# Patient Record
Sex: Female | Born: 1970 | Race: Black or African American | Hispanic: No | Marital: Single | Smoking: Former smoker
Health system: Southern US, Community
[De-identification: ages and names within clinical notes are randomized; demographics above are authoritative.]

## PROBLEM LIST (undated history)

## (undated) DIAGNOSIS — M1990 Unspecified osteoarthritis, unspecified site: Secondary | ICD-10-CM

## (undated) DIAGNOSIS — Z8709 Personal history of other diseases of the respiratory system: Secondary | ICD-10-CM

## (undated) DIAGNOSIS — I10 Essential (primary) hypertension: Secondary | ICD-10-CM

## (undated) DIAGNOSIS — E119 Type 2 diabetes mellitus without complications: Secondary | ICD-10-CM

## (undated) DIAGNOSIS — J45909 Unspecified asthma, uncomplicated: Secondary | ICD-10-CM

## (undated) HISTORY — PX: MOUTH SURGERY: SHX715

## (undated) HISTORY — PX: ANKLE SURGERY: SHX546

---

## 2009-09-08 ENCOUNTER — Emergency Department (HOSPITAL_COMMUNITY): Admission: EM | Admit: 2009-09-08 | Discharge: 2009-09-08 | Payer: Self-pay | Admitting: Family Medicine

## 2010-07-27 ENCOUNTER — Ambulatory Visit (HOSPITAL_COMMUNITY)
Admission: RE | Admit: 2010-07-27 | Discharge: 2010-07-27 | Disposition: A | Discharge status: Home / Self Care | Payer: No Typology Code available for payment source | Source: Ambulatory Visit | Attending: Orthopedic Surgery | Admitting: Orthopedic Surgery

## 2010-07-27 ENCOUNTER — Ambulatory Visit (HOSPITAL_BASED_OUTPATIENT_CLINIC_OR_DEPARTMENT_OTHER)
Admission: RE | Admit: 2010-07-27 | Discharge: 2010-07-27 | Disposition: A | Discharge status: Home / Self Care | Payer: Self-pay | Source: Ambulatory Visit | Attending: Orthopedic Surgery | Admitting: Orthopedic Surgery

## 2010-07-27 ENCOUNTER — Ambulatory Visit
Admission: RE | Admit: 2010-07-27 | Discharge: 2010-07-27 | Disposition: A | Discharge status: Home / Self Care | Payer: No Typology Code available for payment source | Source: Ambulatory Visit | Attending: Orthopedic Surgery | Admitting: Orthopedic Surgery

## 2010-07-27 ENCOUNTER — Other Ambulatory Visit: Payer: Self-pay | Admitting: Orthopedic Surgery

## 2010-07-27 ENCOUNTER — Other Ambulatory Visit (HOSPITAL_COMMUNITY): Payer: Self-pay | Admitting: Orthopedic Surgery

## 2010-07-27 DIAGNOSIS — Y9351 Activity, roller skating (inline) and skateboarding: Secondary | ICD-10-CM | POA: Insufficient documentation

## 2010-07-27 DIAGNOSIS — S82853A Displaced trimalleolar fracture of unspecified lower leg, initial encounter for closed fracture: Secondary | ICD-10-CM | POA: Insufficient documentation

## 2010-07-27 DIAGNOSIS — X58XXXA Exposure to other specified factors, initial encounter: Secondary | ICD-10-CM | POA: Insufficient documentation

## 2010-07-27 DIAGNOSIS — S82851A Displaced trimalleolar fracture of right lower leg, initial encounter for closed fracture: Secondary | ICD-10-CM

## 2010-07-27 DIAGNOSIS — Z01811 Encounter for preprocedural respiratory examination: Secondary | ICD-10-CM

## 2010-07-27 DIAGNOSIS — Z01812 Encounter for preprocedural laboratory examination: Secondary | ICD-10-CM | POA: Insufficient documentation

## 2010-07-27 DIAGNOSIS — Z01810 Encounter for preprocedural cardiovascular examination: Secondary | ICD-10-CM | POA: Insufficient documentation

## 2010-07-27 LAB — POCT I-STAT, CHEM 8
Calcium, Ion: 1.1 mmol/L — ABNORMAL LOW (ref 1.12–1.32)
Glucose, Bld: 109 mg/dL — ABNORMAL HIGH (ref 70–99)
HCT: 44 % (ref 36.0–46.0)
Hemoglobin: 15 g/dL (ref 12.0–15.0)
Potassium: 3.4 mEq/L — ABNORMAL LOW (ref 3.5–5.1)

## 2010-07-31 NOTE — Op Note (Signed)
NAME:  Lisa, [name] NO.:  [id number]  MEDICAL RECORD NO.:  [id number]           PATIENT TYPE:  O  LOCATION:  XRAY                         FACILITY:  [facility]  PHYSICIAN:  Eulas Post, MD    DATE OF BIRTH:  [birth year]  DATE OF PROCEDURE:  07/27/2010 DATE OF DISCHARGE:  07/27/2010                              OPERATIVE REPORT   ATTENDING SURGEON:  Eulas Post, MD  FIRST ASSISTANT:  Janace Litten, orthopedic PA-C  PREOPERATIVE DIAGNOSIS:  Right trimalleolar ankle fracture.  POSTOPERATIVE DIAGNOSIS:  Right trimalleolar ankle fracture.  OPERATIVE PROCEDURE:  Over reduction and internal fixation of right trimalleolar ankle fracture without fixation of the posterior lip.  ANESTHESIA:  General.  ESTIMATED BLOOD LOSS:  Minimal.  TOURNIQUET TIME:  Approximately 1 hour and 20 minutes.  OPERATIVE IMPLANTS:  A Synthes one-third tubular plate size 6-hole with 2 distal cancellous screws and 3 proximal cortical screws with a single interfragmentary lag screw and a total of 2 cannulated 4-0 medial malleolar screws.  PREOPERATIVE INDICATIONS:  Lisa [name] is a 40 year old woman who about 2 weeks ago fell roller-skating and broke her right ankle. She had a fracture dislocation that underwent closed reduction emergently.  She then elected to undergo open reduction and internal fixation in order to optimize healing and minimize the risk for malunion and nonunion.  The risks, benefits, and alternatives were discussed with her before the procedure including but not limited to risks of infection, bleeding, nerve injury, malunion, nonunion, hardware prominence, hardware failure, the need for hardware removal, posttraumatic arthritis, stiffness, regional pain syndrome, cardiopulmonary complications, among others and she is willing to proceed.  OPERATIVE PROCEDURE:  The patient was brought to the operating room and placed in supine position.  IV vancomycin  was given due to a penicillin sensitivity.  After general anesthesia was administered and she also had a regional block, the right lower extremity was prepped and draped in the usual sterile fashion.  The leg was elevated and exsanguinated and tourniquet was inflated.  Incision was made over the distal fibula.  The fracture was identified and cleaned and reduced anatomically and held with clamps.  I placed interfragmentary lag screw.  There was a long separate butterfly segment.  After I had the interfragmentary lag screw, I applied the 6-hole plate.  Excellent fixation was achieved.  I then reduced the butterfly segment and held it and placed a #1 Vicryl cerclage around this butterfly segment after keying it nearly anatomically.  I then turned my attention to the medial side.  Incision was made over the medial malleolus.  There was some posterior fragmentation, and there was also an interposed small area of comminuted cartilage from the medial shoulder of the distal plafond.  This small portion was preventing reduction and was therefore removed.  This was not large enough to hold a screw or able to be maintained.  Therefore, I then reduced the medial malleolus anatomically and held it provisionally with a clamp and then placed 2 cannulated guidewires and confirmed position on AP and lateral views.  I then placed the cannulated screws. Excellent fixation and compression  was achieved.  I then turned my attention to the posterior malleolus.  Under live C-arm guidance, I examined posterior malleolus and took the ankle through a range of motion and the posterior piece was found to be small, maybe 10% of the articular surface at most, and was nearly anatomically reduced after fixation of the medial and lateral sides, and the talus was concentric and the ankle joint stable.  For these reasons, I did not fix the posterior malleolus.  It appeared to be fairly small.  Then, I turned my  attention to the syndesmosis and I stressed the syndesmosis and it was found to be stable with both a cotton test as well as dorsiflexion and external rotation.  Final C-arm pictures were taken.  I irrigated the wounds copiously and repaired them with Vicryl followed by Steri-Strips and sterile gauze and a posterior splint.  The tourniquet was released.  The patient was awakened and returned to PACU in stable and satisfactory condition.  We also did inject her medial side with Marcaine.  Janace Litten, orthopedic PA-C was present and scrubbed throughout the case and critical for completion both with the assistance with reduction as well as exposure and positioning and closure.  She tolerated the procedure well.  There were no complications.     Eulas Post, MD     JPL/MEDQ  D:  07/27/2010  T:  07/28/2010  Job:  [id number]  Electronically Signed by Teryl Lucy MD on 07/31/2010 07:45:14 AM

## 2010-11-19 ENCOUNTER — Inpatient Hospital Stay (INDEPENDENT_AMBULATORY_CARE_PROVIDER_SITE_OTHER)
Admission: RE | Admit: 2010-11-19 | Discharge: 2010-11-19 | Disposition: A | Discharge status: Home / Self Care | Payer: 59 | Source: Ambulatory Visit | Attending: Family Medicine | Admitting: Family Medicine

## 2010-11-19 DIAGNOSIS — R42 Dizziness and giddiness: Secondary | ICD-10-CM

## 2011-04-30 DIAGNOSIS — Z8709 Personal history of other diseases of the respiratory system: Secondary | ICD-10-CM

## 2011-04-30 HISTORY — DX: Personal history of other diseases of the respiratory system: Z87.09

## 2012-04-14 ENCOUNTER — Encounter (HOSPITAL_COMMUNITY): Payer: Self-pay

## 2012-04-14 ENCOUNTER — Encounter (HOSPITAL_COMMUNITY)
Admission: RE | Admit: 2012-04-14 | Discharge: 2012-04-14 | Disposition: A | Discharge status: Home / Self Care | Payer: 59 | Source: Ambulatory Visit | Attending: Orthopaedic Surgery | Admitting: Orthopaedic Surgery

## 2012-04-14 ENCOUNTER — Other Ambulatory Visit (HOSPITAL_COMMUNITY): Payer: Self-pay | Admitting: Orthopaedic Surgery

## 2012-04-14 ENCOUNTER — Encounter (HOSPITAL_COMMUNITY): Payer: Self-pay | Admitting: Pharmacy Technician

## 2012-04-14 ENCOUNTER — Encounter (HOSPITAL_COMMUNITY)
Admission: RE | Admit: 2012-04-14 | Discharge: 2012-04-14 | Disposition: A | Discharge status: Home / Self Care | Payer: 59 | Source: Ambulatory Visit | Attending: Anesthesiology | Admitting: Anesthesiology

## 2012-04-14 DIAGNOSIS — J45909 Unspecified asthma, uncomplicated: Secondary | ICD-10-CM | POA: Insufficient documentation

## 2012-04-14 DIAGNOSIS — F17200 Nicotine dependence, unspecified, uncomplicated: Secondary | ICD-10-CM | POA: Insufficient documentation

## 2012-04-14 DIAGNOSIS — I10 Essential (primary) hypertension: Secondary | ICD-10-CM | POA: Insufficient documentation

## 2012-04-14 DIAGNOSIS — J9383 Other pneumothorax: Secondary | ICD-10-CM | POA: Insufficient documentation

## 2012-04-14 DIAGNOSIS — Z01818 Encounter for other preprocedural examination: Secondary | ICD-10-CM | POA: Insufficient documentation

## 2012-04-14 HISTORY — DX: Essential (primary) hypertension: I10

## 2012-04-14 HISTORY — DX: Unspecified asthma, uncomplicated: J45.909

## 2012-04-14 LAB — SURGICAL PCR SCREEN: Staphylococcus aureus: NEGATIVE

## 2012-04-14 LAB — COMPREHENSIVE METABOLIC PANEL
AST: 18 U/L (ref 0–37)
CO2: 26 mEq/L (ref 19–32)
Chloride: 102 mEq/L (ref 96–112)
Creatinine, Ser: 0.65 mg/dL (ref 0.50–1.10)
GFR calc Af Amer: >90 mL/min (ref >90)
GFR calc non Af Amer: >90 mL/min (ref >90)
Glucose, Bld: 110 mg/dL — ABNORMAL HIGH (ref 70–99)
Total Bilirubin: 0.5 mg/dL (ref 0.3–1.2)

## 2012-04-14 LAB — CBC
MCH: 28.7 pg (ref 26.0–34.0)
MCHC: 33.8 g/dL (ref 30.0–36.0)
Platelets: 279 10*3/uL (ref 150–400)
RBC: 4.56 MIL/uL (ref 3.87–5.11)

## 2012-04-14 LAB — PROTIME-INR
INR: 1.11 (ref 0.00–1.49)
Prothrombin Time: 14.2 seconds (ref 11.6–15.2)

## 2012-04-14 LAB — ABO/RH: ABO/RH(D): O POS

## 2012-04-14 LAB — HCG, SERUM, QUALITATIVE: Preg, Serum: NEGATIVE

## 2012-04-14 NOTE — Pre-Procedure Instructions (Signed)
20 Lisa [name]  04/14/2012   Your procedure is scheduled on:  Apr 23, 2012 (Thursday)  Report to [facility] at 5:30 AM.  Call this number if you have problems the morning of surgery: [phone]   Remember:   Do not eat food:After Midnight.    Take these medicines the morning of surgery with A SIP OF WATER: inhaler, atenolol, pain pill as needed, muscle relaxer as needed   Do not wear jewelry, make-up or nail polish.  Do not wear lotions, powders, or perfumes. You may wear deodorant.  Do not shave 48 hours prior to surgery. Men may shave face and neck.  Do not bring valuables to the hospital.  Contacts, dentures or bridgework may not be worn into surgery.  Leave suitcase in the car. After surgery it may be brought to your room.  For patients admitted to the hospital, checkout time is 11:00 AM the day of discharge.   Patients discharged the day of surgery will not be allowed to drive home.  Name and phone number of your driver:   Special Instructions: Shower using CHG 2 nights before surgery and the night before surgery.  If you shower the day of surgery use CHG.  Use special wash - you have one bottle of CHG for all showers.  You should use approximately 1/3 of the bottle for each shower.   Please read over the following fact sheets that you were given: Pain Booklet, Coughing and Deep Breathing and Surgical Site Infection Prevention

## 2012-04-14 NOTE — Progress Notes (Signed)
DOS U/A, CONSENT NEEDED ORDERS RECEIVED AFTER PAT

## 2012-04-14 NOTE — Progress Notes (Signed)
No orders at PAT, office notified

## 2012-04-16 ENCOUNTER — Encounter (HOSPITAL_COMMUNITY): Payer: Self-pay | Admitting: Vascular Surgery

## 2012-04-16 NOTE — Consult Note (Signed)
Anesthesia chart review: Patient is a 41 year old female scheduled for right total hip arthroplasty by Dr. Magnus Ivan on 04/23/2012. (There were no orders at her PAT visit.) History includes asthma, hypertension, smoking.  EKG on 04/14/2012 showed normal sinus rhythm, minimal voltage criteria for LVH.  Chest x-ray on 04/14/2012 showed 5-10% left apical pneumothorax. No edema or consolidation. Dr. Magnus Ivan has referred her to pulmonologist Dr. Sherene Sires. Patient is scheduled for a preoperative clearance appointment on 04/21/2012.  Preoperative labs noted. Potassium is 2.9. She is on Hyzaar which may be contributing. She is for a UA and will need her K+ repeated prior to surgery.   I spoke with Lisa [name] at Dr. Eliberto Ivory office.  I notified her of patient's K+ results.  She states that Dr. Magnus Ivan is actually planning to cancel her procedure on 04/23/12 and will reschedule once she is cleared by pulmonology.   Shonna Chock, PA-C 04/16/12 1531

## 2012-04-20 ENCOUNTER — Encounter: Payer: Self-pay | Admitting: Internal Medicine

## 2012-04-21 ENCOUNTER — Encounter: Payer: Self-pay | Admitting: Internal Medicine

## 2012-04-21 ENCOUNTER — Ambulatory Visit (INDEPENDENT_AMBULATORY_CARE_PROVIDER_SITE_OTHER): Payer: 59 | Admitting: Internal Medicine

## 2012-04-21 VITALS — BP 150/98 | HR 78 | Temp 97.4°F | Ht 62.0 in | Wt 142.0 lb

## 2012-04-21 DIAGNOSIS — J9383 Other pneumothorax: Secondary | ICD-10-CM

## 2012-04-21 DIAGNOSIS — F17200 Nicotine dependence, unspecified, uncomplicated: Secondary | ICD-10-CM | POA: Insufficient documentation

## 2012-04-21 DIAGNOSIS — I10 Essential (primary) hypertension: Secondary | ICD-10-CM | POA: Insufficient documentation

## 2012-04-21 DIAGNOSIS — J45909 Unspecified asthma, uncomplicated: Secondary | ICD-10-CM | POA: Insufficient documentation

## 2012-04-21 DIAGNOSIS — J449 Chronic obstructive pulmonary disease, unspecified: Secondary | ICD-10-CM

## 2012-04-21 MED ORDER — NEBIVOLOL HCL 5 MG PO TABS: 5.0000 mg | ORAL_TABLET | Freq: Every day | ORAL | Status: DC

## 2012-04-21 MED ORDER — BUDESONIDE-FORMOTEROL FUMARATE 160-4.5 MCG/ACT IN AERO: INHALATION_SPRAY | RESPIRATORY_TRACT | Status: DC

## 2012-04-21 NOTE — Progress Notes (Deleted)
dfd

## 2012-04-21 NOTE — Progress Notes (Signed)
Subjective:     Patient ID: Lisa [name], female   DOB: 02-Sep-1970  MRN: [id number]  HPI  21 yobf smoker with onset asthma early 30's f/u Avbuere considered well controlled on prn saba worse in Fall but typically not needing saba more than than once a week then fell roller skating 06/2010 with first R ankle then R hip problems felt to need   THR by Dr Rayburn Ma 12/26 but L ptx at screening cxr 12/27 so referred 04/21/2012 to pulmonary clinic.   04/21/2012 f/u ov/Suhaan Perleberg cc on narcotic pain meds for hip pain with  No resp  Symptoms and very inactive but no limiting sob or cp, no cough and no unusual habits.   Rare need for saba prior to OV    Sleeping ok without nocturnal  or early am exacerbation  of respiratory  c/o's or need for noct saba. Also denies any obvious fluctuation of symptoms with weather or environmental changes or other aggravating or alleviating factors except as outlined above   ROS  The following are not active complaints unless bolded sore throat, dysphagia, dental problems, itching, sneezing,  nasal congestion or excess/ purulent secretions, ear ache,   fever, chills, sweats, unintended wt loss, pleuritic or exertional cp, hemoptysis,  orthopnea pnd or leg swelling, presyncope, palpitations, heartburn, abdominal pain, anorexia, nausea, vomiting, diarrhea  or change in bowel or urinary habits, change in stools or urine, dysuria,hematuria,  rash, arthralgias, visual complaints, headache, numbness weakness or ataxia or problems with walking or coordination,  change in mood/affect or memory.        Review of Systems     Objective:   Physical Exam Anxious bf nad Wt Readings from Last 3 Encounters:  04/21/12 142 lb (64.411 kg)  04/14/12 140 lb 9.6 oz (63.776 kg)   HEENT mild turbinate edema.  Oropharynx no thrush or excess pnd or cobblestoning.  No JVD or cervical adenopathy. Mild accessory muscle hypertrophy. Trachea midline, nl thryroid. Chest was slt hyperinflated  by percussion with diminished breath sounds esp L apex with insp squeak and mild increased exp time without wheeze. Hoover sign positive at end inspiration. Regular rate and rhythm without murmur gallop or rub or increase P2 or edema.  Abd: no hsm, nl excursion. Ext warm without cyanosis or clubbing.    cxr 04/14/12 Left apical pneumothorax. No edema or consolidation.     Assessment:          Plan:

## 2012-04-21 NOTE — Assessment & Plan Note (Signed)
She may actually have early copd but in any case needs to stop smoking and rx airways  The proper method of use, as well as anticipated side effects, of a metered-dose inhaler are discussed and demonstrated to the patient. Improved effectiveness after extensive coaching during this visit to a level of approximately  75% so try symbicort 160 2bid

## 2012-04-21 NOTE — Assessment & Plan Note (Signed)
Not well controlled on tenormin and Strongly prefer in this setting: Bystolic, the most beta -1  selective Beta blocker available in sample form, with bisoprolol the most selective generic choice  on the market.   Samples given for bystolic 5 mg daily with option to dose bid if not controlling  bp to sys < 140 or dias < 90

## 2012-04-21 NOTE — Patient Instructions (Addendum)
You a small hole in the surface of lung that's the result of smoking plus asthma  Stop tenormin  Bystolic 5 mg daily take it twice daily if blood pressure not controlled  symbicort 160 Take 2 puffs first thing in am and then another 2 puffs about 12 hours later.   The treatment is to treat the asthma x 2 weeks while not smoking at all and return in 2 weeks for chest xray and we can probably clear for surgery next year.

## 2012-04-21 NOTE — Assessment & Plan Note (Signed)
Very unusual finding on a preop cxr and I suspect it's asymptomatic based on the amt of narcotic pain meds she uses for hip but in any case would be a strong contraindication to elective surgery which she should postpone until we address this.  See instructions for specific recommendations which were reviewed directly with the patient who was given a copy with highlighter outlining the key components.

## 2012-04-21 NOTE — Assessment & Plan Note (Signed)

## 2012-04-23 ENCOUNTER — Encounter (HOSPITAL_COMMUNITY): Admission: RE | Payer: Self-pay | Source: Ambulatory Visit

## 2012-04-23 ENCOUNTER — Ambulatory Visit (HOSPITAL_COMMUNITY): Admission: RE | Admit: 2012-04-23 | Payer: 59 | Source: Ambulatory Visit | Admitting: Orthopaedic Surgery

## 2012-04-23 SURGERY — ARTHROPLASTY, HIP, TOTAL, ANTERIOR APPROACH
Anesthesia: General | Laterality: Right

## 2012-05-12 ENCOUNTER — Ambulatory Visit: Payer: 59 | Admitting: Internal Medicine

## 2013-07-26 ENCOUNTER — Telehealth: Payer: Self-pay | Admitting: Internal Medicine

## 2013-07-26 NOTE — Telephone Encounter (Signed)
Spoke with Seward GraterMaggie  She states that the pt is needing appt for clearance for hip replacement surgery  She is refusing to see Dr. Sherene SiresWert and wants to switch to another provider here  Please advise if okay thanks!

## 2013-07-26 NOTE — Telephone Encounter (Signed)
Fine with me

## 2013-07-27 NOTE — Telephone Encounter (Signed)
Ok with me but needs to be a 30 minute slot.

## 2013-07-27 NOTE — Telephone Encounter (Signed)
Dr. Delton CoombesByrum you have the first available consult slot, are you ok with taking this pt on? Please advise. Carron CurieJennifer Castillo, CMA

## 2013-07-28 NOTE — Telephone Encounter (Signed)
Called spoke with Seward GraterMaggie -- the first available consult slot with RB is 4.22.15.  Maggie placed me on hold to check with Dr Rayburn MaBlackmon > this date is fine and if patient wants something sooner, she will need to see Dr Sherene SiresWert.  Maggie asked if we could call the pt.  ATC pt at the number we have on file for her.  This is an incorrect number. Called Seward GraterMaggie again who supplied an alternate number >> [phone].  Called that number and spoke with patient.  She does not want to wait until 4.22.15 to be seen and is okay with a sooner appt with MW.  Surgical clearance appt scheduled with MW for 4.14.15 @ 1130.  Nothing further needed; will sign off. **pt's demographics have been updated

## 2013-08-10 ENCOUNTER — Encounter: Payer: Self-pay | Admitting: Internal Medicine

## 2013-08-10 ENCOUNTER — Ambulatory Visit (INDEPENDENT_AMBULATORY_CARE_PROVIDER_SITE_OTHER): Payer: Medicaid Other | Admitting: Internal Medicine

## 2013-08-10 ENCOUNTER — Ambulatory Visit (INDEPENDENT_AMBULATORY_CARE_PROVIDER_SITE_OTHER)
Admission: RE | Admit: 2013-08-10 | Discharge: 2013-08-10 | Disposition: A | Discharge status: Home / Self Care | Payer: Medicaid Other | Source: Ambulatory Visit | Attending: Internal Medicine | Admitting: Internal Medicine

## 2013-08-10 ENCOUNTER — Encounter (INDEPENDENT_AMBULATORY_CARE_PROVIDER_SITE_OTHER): Payer: Self-pay

## 2013-08-10 VITALS — BP 104/64 | HR 106 | Temp 98.4°F

## 2013-08-10 DIAGNOSIS — J9383 Other pneumothorax: Secondary | ICD-10-CM | POA: Diagnosis not present

## 2013-08-10 DIAGNOSIS — J45909 Unspecified asthma, uncomplicated: Secondary | ICD-10-CM

## 2013-08-10 DIAGNOSIS — F17200 Nicotine dependence, unspecified, uncomplicated: Secondary | ICD-10-CM

## 2013-08-10 MED ORDER — PREDNISONE 10 MG PO TABS: ORAL_TABLET | ORAL | Status: DC

## 2013-08-10 NOTE — Progress Notes (Signed)
Subjective:     Patient ID: Lisa [name], female   DOB: [birth year]  MRN: [id number]    Brief patient profile:  [age] yobf smoker with onset asthma early 30's f/u Dr Concepcion ElkAvbuere considered well controlled on prn saba worse in Fall but typically not needing saba more than than once a week then fell roller skating 06/2010 with first R ankle then R hip problems felt to need   THR by Dr Rayburn MaBlackmon 12/26 but L ptx at screening cxr 12/27 so referred 04/21/2012 to pulmonary clinic.  History of Present Illness  04/21/2012 f/u ov/Wert cc on narcotic pain meds for hip pain with  No resp  Symptoms and very inactive but no limiting sob or cp, no cough and no unusual habits.   Rare need for saba prior to OV   rec You a small hole in the surface of lung that's the result of smoking plus asthma Stop tenormin Bystolic 5 mg daily take it twice daily if blood pressure not controlled symbicort 160 Take 2 puffs first thing in am and then another 2 puffs about 12 hours later.  The treatment is to treat the asthma x 2 weeks while not smoking at all and return in 2 weeks for chest xray and we can probably clear for surgery next year. > did not keep f/u appt    08/10/2013 f/u ov/Wert re:  Still smoking/ symbicort 160  2bid and prn proair once or twice a week  Chief Complaint  Patient presents with  . Follow-up    Pt here for pulmonary clearance for total rt hip replacement. She c/o increased SOB and prod cough with minimal clear sputum x 2 days. She reports wheezing x 2 wks.   hfa not optimal   No obvious day to day or daytime variabilty or assoc   cp or chest tightness, subjective wheeze overt sinus or hb symptoms. No unusual exp hx or h/o childhood pna/ asthma or knowledge of premature birth.  Sleeping ok without nocturnal  or early am exacerbation  of respiratory  c/o's or need for noct saba. Also denies any obvious fluctuation of symptoms with weather or environmental changes or other aggravating or  alleviating factors except as outlined above   Current Medications, Allergies, Complete Past Medical History, Past Surgical History, Family History, and Social History were reviewed in [facility] electronic medical record.  ROS  The following are not active complaints unless bolded sore throat, dysphagia, dental problems, itching, sneezing,  nasal congestion or excess/ purulent secretions, ear ache,   fever, chills, sweats, unintended wt loss, pleuritic or exertional cp, hemoptysis,  orthopnea pnd or leg swelling, presyncope, palpitations, heartburn, abdominal pain, anorexia, nausea, vomiting, diarrhea  or change in bowel or urinary habits, change in stools or urine, dysuria,hematuria,  rash, arthralgias, visual complaints, headache, numbness weakness or ataxia or problems with walking or coordination,  change in mood/affect or memory.               Objective:   Physical Exam  Anxious bf nad at rest but very difficult ambulation on crutch due to hip pain  Wt Readings from Last 3 Encounters:  04/21/12 142 lb (64.411 kg)  04/14/12 140 lb 9.6 oz (63.776 kg)          HEENT mild turbinate edema.  Oropharynx no thrush or excess pnd or cobblestoning.  No JVD or cervical adenopathy. Mild accessory muscle hypertrophy. Trachea midline, nl thryroid. Chest was slt hyperinflated by percussion with diminished breath sounds  esp L apex with insp squeak and mild increased exp time with trace  wheeze. Hoover sign positive at end inspiration. Regular rate and rhythm without murmur gallop or rub or increase P2 or edema.  Abd: no hsm, nl excursion. Ext warm without cyanosis or clubbing.     CXR  08/10/2013 :  1. No acute cardiopulmonary disease. No pneumothorax.      Assessment:

## 2013-08-10 NOTE — Assessment & Plan Note (Signed)
Resolved, no problem for surgery

## 2013-08-10 NOTE — Patient Instructions (Addendum)
Prednisone 10 mg take  4 each am x 2 days,   2 each am x 2 days,  1 each am x 2 days and stop   symbicort 160 Take 2 puffs first thing in am and then another 2 puffs about 12 hours later   Work on inhaler technique:  relax and gently blow all the way out then take a nice smooth deep breath back in, triggering the inhaler at same time you start breathing in.  Hold for up to 5 seconds if you can.  Rinse and gargle with water when done  Only use your albuterol as a rescue medication to be used if you can't catch your breath by resting or doing a relaxed purse lip breathing pattern.  - The less you use it, the better it will work when you need it. - Ok to use up to 2 puffs  every 4 hours if you must but call for immediate appointment if use goes up over your usual need - Don't leave home without it !!  (think of it like the spare tire for your car)   Please remember to go to the x-ray department downstairs for your tests - we will call you with the results when they are available.    You are cleared for surgery but need to quit smoking completely for 2 weeks if possible pre op

## 2013-08-14 NOTE — Assessment & Plan Note (Signed)
>   3 min  Discussed short term (preop) and longterm needs to quit smoking to reduce risk of refractory AB worsening irreversible  lung dysfunction

## 2013-08-14 NOTE — Assessment & Plan Note (Addendum)
DDX of  difficult airways managment all start with A and  include Adherence, Ace Inhibitors, Acid Reflux, Active Sinus Disease, Alpha 1 Antitripsin deficiency, Anxiety masquerading as Airways dz,  ABPA,  allergy(esp in young), Aspiration (esp in elderly), Adverse effects of DPI,  Active smokers, plus two Bs  = Bronchiectasis and Beta blocker use..and one C= CHF  Adherence is always the initial "prime suspect" and is a multilayered concern that requires a "trust but verify" approach in every patient - starting with knowing how to use medications, especially inhalers, correctly, keeping up with refills and understanding the fundamental difference between maintenance and prns vs those medications only taken for a very short course and then stopped and not refilled.  The proper method of use, as well as anticipated side effects, of a metered-dose inhaler are discussed and demonstrated to the patient. Improved effectiveness after extensive coaching during this visit to a level of approximately  75% so should continue symbicort 160 2bid  ? Allergy > Prednisone 10 mg take  4 each am x 2 days,   2 each am x 2 days,  1 each am x 2 days and stop   Active smoking > discussed separately

## 2013-08-18 ENCOUNTER — Institutional Professional Consult (permissible substitution): Payer: 59 | Admitting: Emergency Medicine

## 2013-10-08 ENCOUNTER — Other Ambulatory Visit (HOSPITAL_COMMUNITY): Payer: Self-pay | Admitting: Orthopaedic Surgery

## 2013-10-08 NOTE — Progress Notes (Signed)
Surgery on 10/22/13.  Preop on 10/14/13 at 1100am.  Need orders in EPIC.  Thank You.

## 2013-10-12 ENCOUNTER — Encounter (HOSPITAL_COMMUNITY): Payer: Self-pay | Admitting: Pharmacy Technician

## 2013-10-14 ENCOUNTER — Other Ambulatory Visit: Payer: Self-pay

## 2013-10-14 ENCOUNTER — Encounter (HOSPITAL_COMMUNITY)
Admission: RE | Admit: 2013-10-14 | Discharge: 2013-10-14 | Disposition: A | Discharge status: Home / Self Care | Payer: Medicaid Other | Source: Ambulatory Visit | Attending: Orthopaedic Surgery | Admitting: Orthopaedic Surgery

## 2013-10-14 ENCOUNTER — Encounter (HOSPITAL_COMMUNITY): Payer: Self-pay

## 2013-10-14 DIAGNOSIS — Z01810 Encounter for preprocedural cardiovascular examination: Secondary | ICD-10-CM | POA: Insufficient documentation

## 2013-10-14 DIAGNOSIS — Z01812 Encounter for preprocedural laboratory examination: Secondary | ICD-10-CM | POA: Insufficient documentation

## 2013-10-14 HISTORY — DX: Unspecified osteoarthritis, unspecified site: M19.90

## 2013-10-14 HISTORY — DX: Personal history of other diseases of the respiratory system: Z87.09

## 2013-10-14 LAB — BASIC METABOLIC PANEL
BUN: 16 mg/dL (ref 6–23)
CO2: 24 mEq/L (ref 19–32)
Calcium: 10.1 mg/dL (ref 8.4–10.5)
Chloride: 99 mEq/L (ref 96–112)
Creatinine, Ser: 0.63 mg/dL (ref 0.50–1.10)
GFR calc non Af Amer: >90 mL/min (ref >90)
GLUCOSE: 107 mg/dL — AB (ref 70–99)
POTASSIUM: 3.2 meq/L — AB (ref 3.7–5.3)
SODIUM: 137 meq/L (ref 137–147)

## 2013-10-14 LAB — ABO/RH: ABO/RH(D): O POS

## 2013-10-14 LAB — URINALYSIS, ROUTINE W REFLEX MICROSCOPIC
Bilirubin Urine: NEGATIVE
Glucose, UA: NEGATIVE mg/dL
Ketones, ur: NEGATIVE mg/dL
LEUKOCYTES UA: NEGATIVE
Nitrite: NEGATIVE
PH: 6 (ref 5.0–8.0)
Protein, ur: NEGATIVE mg/dL
SPECIFIC GRAVITY, URINE: 1.016 (ref 1.005–1.030)
Urobilinogen, UA: 1 mg/dL (ref 0.0–1.0)

## 2013-10-14 LAB — CBC
HCT: 40.9 % (ref 36.0–46.0)
HEMOGLOBIN: 14.1 g/dL (ref 12.0–15.0)
MCH: 28.9 pg (ref 26.0–34.0)
MCHC: 34.5 g/dL (ref 30.0–36.0)
MCV: 83.8 fL (ref 78.0–100.0)
Platelets: 281 10*3/uL (ref 150–400)
RBC: 4.88 MIL/uL (ref 3.87–5.11)
RDW: 14.4 % (ref 11.5–15.5)
WBC: 8.3 10*3/uL (ref 4.0–10.5)

## 2013-10-14 LAB — URINE MICROSCOPIC-ADD ON

## 2013-10-14 LAB — PROTIME-INR
INR: 1.01 (ref 0.00–1.49)
Prothrombin Time: 13.1 seconds (ref 11.6–15.2)

## 2013-10-14 LAB — APTT: aPTT: 33 seconds (ref 24–37)

## 2013-10-14 LAB — SURGICAL PCR SCREEN
MRSA, PCR: NEGATIVE
Staphylococcus aureus: NEGATIVE

## 2013-10-14 LAB — HCG, SERUM, QUALITATIVE: Preg, Serum: NEGATIVE

## 2013-10-14 NOTE — Patient Instructions (Addendum)
Lisa [name]  10/14/2013                           YOUR PROCEDURE IS SCHEDULED ON: 10/22/13 AT 9:45 AM               ENTER THRU [facility] ENTRANCE AND                            FOLLOW  SIGNS TO SHORT STAY CENTER                 ARRIVE AT SHORT STAY AT: 7:45 AM               CALL THIS NUMBER IF ANY PROBLEMS THE DAY OF SURGERY :               [phone]                                REMEMBER:   Do not eat food or drink liquids AFTER MIDNIGHT                 Take these medicines the morning of surgery with               A SIPS OF WATER :  AMLODIPINE / SYMBICORT / MAY USE ALBUTEROL , VICODIN IF NEEDED       Do not wear jewelry, make-up   Do not wear lotions, powders, or perfumes.   Do not shave legs or underarms 12 hrs. before surgery (men may shave face)  Do not bring valuables to the hospital.  Contacts, dentures or bridgework may not be worn into surgery.  Leave suitcase in the car. After surgery it may be brought to your room.  For patients admitted to the hospital more than one night, checkout time is            11:00 AM                                                      ________________________________________________________________________                                                                                                              - PREPARING FOR SURGERY  Before surgery, you can play an important role.  Because skin is not sterile, your skin needs to be as free of germs as possible.  You can reduce the number of germs on your skin by washing with CHG (chlorahexidine gluconate) soap before surgery.  CHG is an antiseptic cleaner which kills germs and bonds with the skin to continue killing germs even after washing. Please DO NOT use if you have an allergy to CHG  or antibacterial soaps.  If your skin becomes reddened/irritated stop using the CHG and inform your nurse when you arrive at Short Stay. Do not  shave (including legs and underarms) for at least 48 hours prior to the first CHG shower.  You may shave your face. Please follow these instructions carefully:   1.  Shower with CHG Soap the night before surgery and the  morning of Surgery.   2.  If you choose to wash your hair, wash your hair first as usual with your  normal  Shampoo.   3.  After you shampoo, rinse your hair and body thoroughly to remove the  shampoo.                                         4.  Use CHG as you would any other liquid soap.  You can apply chg directly  to the skin and wash . Gently wash with scrungie or clean wascloth    5.  Apply the CHG Soap to your body ONLY FROM THE NECK DOWN.   Do not use on open                           Wound or open sores. Avoid contact with eyes, ears mouth and genitals (private parts).                        Genitals (private parts) with your normal soap.              6.  Wash thoroughly, paying special attention to the area where your surgery  will be performed.   7.  Thoroughly rinse your body with warm water from the neck down.   8.  DO NOT shower/wash with your normal soap after using and rinsing off  the CHG Soap .                9.  Pat yourself dry with a clean towel.             10.  Wear clean pajamas.             11.  Place clean sheets on your bed the night of your first shower and do not  sleep with pets.  Day of Surgery : Do not apply any lotions/deodorants the morning of surgery.  Please wear clean clothes to the hospital/surgery center.  FAILURE TO FOLLOW THESE INSTRUCTIONS MAY RESULT IN THE CANCELLATION OF YOUR SURGERY    PATIENT SIGNATURE_________________________________    WHAT IS A BLOOD TRANSFUSION? Blood Transfusion Information  A transfusion is the replacement of blood or some of its parts. Blood is made up of multiple cells which provide different functions.  Red blood cells carry oxygen and are used for blood loss replacement.  White blood  cells fight against infection.  Platelets control bleeding.  Plasma helps clot blood.  Other blood products are available for specialized needs, such as hemophilia or other clotting disorders. BEFORE THE TRANSFUSION  Who gives blood for transfusions?   Healthy volunteers who are fully evaluated to make sure their blood is safe. This is blood bank blood. Transfusion therapy is the safest it has ever been in the practice of medicine. Before blood is taken from a donor, a complete history is taken to make sure that person  has no history of diseases nor engages in risky social behavior (examples are intravenous drug use or sexual activity with multiple partners). The donor's travel history is screened to minimize risk of transmitting infections, such as malaria. The donated blood is tested for signs of infectious diseases, such as HIV and hepatitis. The blood is then tested to be sure it is compatible with you in order to minimize the chance of a transfusion reaction. If you or a relative donates blood, this is often done in anticipation of surgery and is not appropriate for emergency situations. It takes many days to process the donated blood. RISKS AND COMPLICATIONS Although transfusion therapy is very safe and saves many lives, the main dangers of transfusion include:   Getting an infectious disease.  Developing a transfusion reaction. This is an allergic reaction to something in the blood you were given. Every precaution is taken to prevent this. The decision to have a blood transfusion has been considered carefully by your caregiver before blood is given. Blood is not given unless the benefits outweigh the risks. AFTER THE TRANSFUSION  Right after receiving a blood transfusion, you will usually feel much better and more energetic. This is especially true if your red blood cells have gotten low (anemic). The transfusion raises the level of the red blood cells which carry oxygen, and this usually  causes an energy increase.  The nurse administering the transfusion will monitor you carefully for complications. HOME CARE INSTRUCTIONS  No special instructions are needed after a transfusion. You may find your energy is better. Speak with your caregiver about any limitations on activity for underlying diseases you may have. SEEK MEDICAL CARE IF:   Your condition is not improving after your transfusion.  You develop redness or irritation at the intravenous (IV) site. SEEK IMMEDIATE MEDICAL CARE IF:  Any of the following symptoms occur over the next 12 hours:  Shaking chills.  You have a temperature by mouth above 102 F (38.9 C), not controlled by medicine.  Chest, back, or muscle pain.  People around you feel you are not acting correctly or are confused.  Shortness of breath or difficulty breathing.  Dizziness and fainting.  You get a rash or develop hives.  You have a decrease in urine output.  Your urine turns a dark color or changes to pink, red, or brown. Any of the following symptoms occur over the next 10 days:  You have a temperature by mouth above 102 F (38.9 C), not controlled by medicine.  Shortness of breath.  Weakness after normal activity.  The white part of the eye turns yellow (jaundice).  You have a decrease in the amount of urine or are urinating less often.  Your urine turns a dark color or changes to pink, red, or brown. Document Released: 04/12/2000 Document Revised: 07/08/2011 Document Reviewed: 11/30/2007 [facility] Patient Information 2014 [place], [place].  _______________________________________________________________________

## 2013-10-22 ENCOUNTER — Inpatient Hospital Stay (HOSPITAL_COMMUNITY): Payer: Medicaid Other

## 2013-10-22 ENCOUNTER — Encounter (HOSPITAL_COMMUNITY): Payer: Self-pay | Admitting: *Deleted

## 2013-10-22 ENCOUNTER — Encounter (HOSPITAL_COMMUNITY)
Admission: RE | Disposition: A | Discharge status: Skilled Nursing Facility | Payer: Self-pay | Source: Ambulatory Visit | Attending: Orthopaedic Surgery

## 2013-10-22 ENCOUNTER — Inpatient Hospital Stay (HOSPITAL_COMMUNITY)
Admission: RE | Admit: 2013-10-22 | Discharge: 2013-10-26 | DRG: 470 | Disposition: A | Discharge status: Skilled Nursing Facility | Payer: Medicaid Other | Source: Ambulatory Visit | Attending: Orthopaedic Surgery | Admitting: Orthopaedic Surgery

## 2013-10-22 ENCOUNTER — Inpatient Hospital Stay (HOSPITAL_COMMUNITY): Payer: Medicaid Other | Admitting: Anesthesiology

## 2013-10-22 ENCOUNTER — Encounter (HOSPITAL_COMMUNITY): Payer: Medicaid Other | Admitting: Anesthesiology

## 2013-10-22 DIAGNOSIS — M87059 Idiopathic aseptic necrosis of unspecified femur: Principal | ICD-10-CM | POA: Diagnosis present

## 2013-10-22 DIAGNOSIS — D62 Acute posthemorrhagic anemia: Secondary | ICD-10-CM | POA: Diagnosis not present

## 2013-10-22 DIAGNOSIS — Z96649 Presence of unspecified artificial hip joint: Secondary | ICD-10-CM

## 2013-10-22 DIAGNOSIS — M8970 Major osseous defect, unspecified site: Secondary | ICD-10-CM | POA: Diagnosis present

## 2013-10-22 DIAGNOSIS — E871 Hypo-osmolality and hyponatremia: Secondary | ICD-10-CM | POA: Diagnosis not present

## 2013-10-22 DIAGNOSIS — I10 Essential (primary) hypertension: Secondary | ICD-10-CM | POA: Diagnosis present

## 2013-10-22 DIAGNOSIS — M62838 Other muscle spasm: Secondary | ICD-10-CM | POA: Diagnosis not present

## 2013-10-22 DIAGNOSIS — J45909 Unspecified asthma, uncomplicated: Secondary | ICD-10-CM | POA: Diagnosis present

## 2013-10-22 DIAGNOSIS — Z01812 Encounter for preprocedural laboratory examination: Secondary | ICD-10-CM

## 2013-10-22 DIAGNOSIS — Z6825 Body mass index (BMI) 25.0-25.9, adult: Secondary | ICD-10-CM

## 2013-10-22 DIAGNOSIS — M87051 Idiopathic aseptic necrosis of right femur: Secondary | ICD-10-CM

## 2013-10-22 DIAGNOSIS — F17200 Nicotine dependence, unspecified, uncomplicated: Secondary | ICD-10-CM | POA: Diagnosis present

## 2013-10-22 DIAGNOSIS — E876 Hypokalemia: Secondary | ICD-10-CM | POA: Diagnosis not present

## 2013-10-22 HISTORY — PX: TOTAL HIP ARTHROPLASTY: SHX124

## 2013-10-22 LAB — TYPE AND SCREEN
ABO/RH(D): O POS
ANTIBODY SCREEN: NEGATIVE

## 2013-10-22 SURGERY — ARTHROPLASTY, HIP, TOTAL, ANTERIOR APPROACH
Anesthesia: Spinal | Site: Hip | Laterality: Right

## 2013-10-22 MED ORDER — METOCLOPRAMIDE HCL 10 MG PO TABS: 5.0000 mg | ORAL_TABLET | Freq: Three times a day (TID) | ORAL | Status: DC | PRN

## 2013-10-22 MED ORDER — SODIUM CHLORIDE 0.9 % IR SOLN
Status: DC | PRN
  Administered 2013-10-22: 1000 mL

## 2013-10-22 MED ORDER — PROMETHAZINE HCL 25 MG/ML IJ SOLN
INTRAMUSCULAR | Status: AC
  Filled 2013-10-22: qty 1

## 2013-10-22 MED ORDER — ACETAMINOPHEN 325 MG PO TABS
650.0000 mg | ORAL_TABLET | Freq: Four times a day (QID) | ORAL | Status: DC | PRN
  Administered 2013-10-22 – 2013-10-26 (×11): 650 mg via ORAL
  Filled 2013-10-22 (×12): qty 2

## 2013-10-22 MED ORDER — OXYCODONE HCL 5 MG PO TABS
5.0000 mg | ORAL_TABLET | ORAL | Status: DC | PRN
  Administered 2013-10-22 – 2013-10-26 (×24): 10 mg via ORAL
  Filled 2013-10-22 (×24): qty 2

## 2013-10-22 MED ORDER — MENTHOL 3 MG MT LOZG: 1.0000 | LOZENGE | OROMUCOSAL | Status: DC | PRN

## 2013-10-22 MED ORDER — CLINDAMYCIN PHOSPHATE 900 MG/50ML IV SOLN
INTRAVENOUS | Status: AC
  Filled 2013-10-22: qty 50

## 2013-10-22 MED ORDER — KETAMINE HCL 10 MG/ML IJ SOLN
INTRAMUSCULAR | Status: AC
  Filled 2013-10-22: qty 1

## 2013-10-22 MED ORDER — ACETAMINOPHEN 10 MG/ML IV SOLN
1000.0000 mg | Freq: Once | INTRAVENOUS | Status: AC
  Administered 2013-10-22: 1000 mg via INTRAVENOUS
  Filled 2013-10-22 (×2): qty 100

## 2013-10-22 MED ORDER — AMLODIPINE BESYLATE 5 MG PO TABS
5.0000 mg | ORAL_TABLET | Freq: Every morning | ORAL | Status: DC
  Administered 2013-10-23 – 2013-10-26 (×4): 5 mg via ORAL
  Filled 2013-10-22 (×4): qty 1

## 2013-10-22 MED ORDER — FENTANYL CITRATE 0.05 MG/ML IJ SOLN
INTRAMUSCULAR | Status: DC | PRN
  Administered 2013-10-22: 100 ug via INTRAVENOUS

## 2013-10-22 MED ORDER — MIDAZOLAM HCL 2 MG/2ML IJ SOLN
INTRAMUSCULAR | Status: AC
  Filled 2013-10-22: qty 2

## 2013-10-22 MED ORDER — PHENYLEPHRINE HCL 10 MG/ML IJ SOLN
INTRAMUSCULAR | Status: AC
  Filled 2013-10-22: qty 1

## 2013-10-22 MED ORDER — METHOCARBAMOL 500 MG PO TABS
500.0000 mg | ORAL_TABLET | Freq: Four times a day (QID) | ORAL | Status: DC | PRN
  Administered 2013-10-23 – 2013-10-26 (×12): 500 mg via ORAL
  Filled 2013-10-22 (×12): qty 1

## 2013-10-22 MED ORDER — METHOCARBAMOL 1000 MG/10ML IJ SOLN
500.0000 mg | Freq: Four times a day (QID) | INTRAVENOUS | Status: DC | PRN
  Administered 2013-10-22 (×2): 500 mg via INTRAVENOUS
  Filled 2013-10-22 (×2): qty 5

## 2013-10-22 MED ORDER — OXYCODONE HCL 5 MG PO TABS: 5.0000 mg | ORAL_TABLET | Freq: Once | ORAL | Status: DC | PRN

## 2013-10-22 MED ORDER — FENTANYL CITRATE 0.05 MG/ML IJ SOLN
INTRAMUSCULAR | Status: AC
  Filled 2013-10-22: qty 2

## 2013-10-22 MED ORDER — CLINDAMYCIN PHOSPHATE 900 MG/50ML IV SOLN
900.0000 mg | INTRAVENOUS | Status: AC
  Administered 2013-10-22: 900 mg via INTRAVENOUS

## 2013-10-22 MED ORDER — PHENYLEPHRINE 40 MCG/ML (10ML) SYRINGE FOR IV PUSH (FOR BLOOD PRESSURE SUPPORT)
PREFILLED_SYRINGE | INTRAVENOUS | Status: AC
  Filled 2013-10-22: qty 10

## 2013-10-22 MED ORDER — PROPOFOL 10 MG/ML IV BOLUS
INTRAVENOUS | Status: AC
  Filled 2013-10-22: qty 20

## 2013-10-22 MED ORDER — PNEUMOCOCCAL VAC POLYVALENT 25 MCG/0.5ML IJ INJ
0.5000 mL | INJECTION | INTRAMUSCULAR | Status: DC
  Filled 2013-10-22 (×3): qty 0.5

## 2013-10-22 MED ORDER — ONDANSETRON HCL 4 MG PO TABS: 4.0000 mg | ORAL_TABLET | Freq: Four times a day (QID) | ORAL | Status: DC | PRN

## 2013-10-22 MED ORDER — ALUM & MAG HYDROXIDE-SIMETH 200-200-20 MG/5ML PO SUSP: 30.0000 mL | ORAL | Status: DC | PRN

## 2013-10-22 MED ORDER — LOSARTAN POTASSIUM-HCTZ 100-25 MG PO TABS: 1.0000 | ORAL_TABLET | Freq: Every morning | ORAL | Status: DC

## 2013-10-22 MED ORDER — HYDROMORPHONE HCL PF 1 MG/ML IJ SOLN
1.0000 mg | INTRAMUSCULAR | Status: DC | PRN
  Administered 2013-10-22 – 2013-10-25 (×9): 1 mg via INTRAVENOUS
  Filled 2013-10-22 (×9): qty 1

## 2013-10-22 MED ORDER — SODIUM CHLORIDE 0.9 % IJ SOLN
INTRAMUSCULAR | Status: AC
  Filled 2013-10-22: qty 10

## 2013-10-22 MED ORDER — DIPHENHYDRAMINE HCL 12.5 MG/5ML PO ELIX: 12.5000 mg | ORAL_SOLUTION | ORAL | Status: DC | PRN

## 2013-10-22 MED ORDER — SODIUM CHLORIDE 0.9 % IV SOLN: INTRAVENOUS | Status: DC

## 2013-10-22 MED ORDER — PHENYLEPHRINE HCL 10 MG/ML IJ SOLN
INTRAMUSCULAR | Status: DC | PRN
  Administered 2013-10-22 (×2): 80 ug via INTRAVENOUS
  Administered 2013-10-22: 60 ug via INTRAVENOUS
  Administered 2013-10-22: 40 ug via INTRAVENOUS

## 2013-10-22 MED ORDER — 0.9 % SODIUM CHLORIDE (POUR BTL) OPTIME
TOPICAL | Status: DC | PRN
  Administered 2013-10-22: 1000 mL

## 2013-10-22 MED ORDER — PROMETHAZINE HCL 25 MG/ML IJ SOLN
6.2500 mg | INTRAMUSCULAR | Status: DC | PRN
Start: 2013-10-22 — End: 2013-10-22
  Administered 2013-10-22: 6.25 mg via INTRAVENOUS

## 2013-10-22 MED ORDER — TRANEXAMIC ACID 100 MG/ML IV SOLN
1000.0000 mg | INTRAVENOUS | Status: AC
  Administered 2013-10-22: 1000 mg via INTRAVENOUS
  Filled 2013-10-22: qty 10

## 2013-10-22 MED ORDER — LOSARTAN POTASSIUM 50 MG PO TABS
100.0000 mg | ORAL_TABLET | Freq: Every day | ORAL | Status: DC
  Administered 2013-10-23 – 2013-10-26 (×4): 100 mg via ORAL
  Filled 2013-10-22 (×4): qty 2

## 2013-10-22 MED ORDER — HYDROMORPHONE HCL PF 1 MG/ML IJ SOLN: 0.2500 mg | INTRAMUSCULAR | Status: DC | PRN

## 2013-10-22 MED ORDER — ZOLPIDEM TARTRATE 5 MG PO TABS: 5.0000 mg | ORAL_TABLET | Freq: Every evening | ORAL | Status: DC | PRN

## 2013-10-22 MED ORDER — MEPERIDINE HCL 50 MG/ML IJ SOLN: 6.2500 mg | INTRAMUSCULAR | Status: DC | PRN

## 2013-10-22 MED ORDER — POLYETHYLENE GLYCOL 3350 17 G PO PACK
17.0000 g | PACK | Freq: Every day | ORAL | Status: DC | PRN
  Administered 2013-10-23: 17 g via ORAL
  Filled 2013-10-22: qty 1

## 2013-10-22 MED ORDER — MIDAZOLAM HCL 5 MG/5ML IJ SOLN
INTRAMUSCULAR | Status: DC | PRN
  Administered 2013-10-22: 2 mg via INTRAVENOUS

## 2013-10-22 MED ORDER — DOCUSATE SODIUM 100 MG PO CAPS
100.0000 mg | ORAL_CAPSULE | Freq: Two times a day (BID) | ORAL | Status: DC
  Administered 2013-10-22 – 2013-10-26 (×8): 100 mg via ORAL
  Filled 2013-10-22 (×5): qty 1

## 2013-10-22 MED ORDER — BUPIVACAINE HCL (PF) 0.5 % IJ SOLN
INTRAMUSCULAR | Status: DC | PRN
  Administered 2013-10-22: 3 mL

## 2013-10-22 MED ORDER — ONDANSETRON HCL 4 MG/2ML IJ SOLN: 4.0000 mg | Freq: Four times a day (QID) | INTRAMUSCULAR | Status: DC | PRN

## 2013-10-22 MED ORDER — HYDROCHLOROTHIAZIDE 25 MG PO TABS
25.0000 mg | ORAL_TABLET | Freq: Every day | ORAL | Status: DC
  Administered 2013-10-23 – 2013-10-26 (×4): 25 mg via ORAL
  Filled 2013-10-22 (×4): qty 1

## 2013-10-22 MED ORDER — METOCLOPRAMIDE HCL 5 MG/ML IJ SOLN: 5.0000 mg | Freq: Three times a day (TID) | INTRAMUSCULAR | Status: DC | PRN

## 2013-10-22 MED ORDER — DEXTROSE 5 % IV SOLN
10.0000 mg | INTRAVENOUS | Status: DC | PRN
  Administered 2013-10-22: 10 ug/min via INTRAVENOUS

## 2013-10-22 MED ORDER — ASPIRIN EC 325 MG PO TBEC
325.0000 mg | DELAYED_RELEASE_TABLET | Freq: Two times a day (BID) | ORAL | Status: DC
  Administered 2013-10-22 – 2013-10-26 (×8): 325 mg via ORAL
  Filled 2013-10-22 (×10): qty 1

## 2013-10-22 MED ORDER — LACTATED RINGERS IV SOLN
INTRAVENOUS | Status: DC | PRN
  Administered 2013-10-22 (×3): via INTRAVENOUS

## 2013-10-22 MED ORDER — ALBUTEROL SULFATE (2.5 MG/3ML) 0.083% IN NEBU: 3.0000 mL | INHALATION_SOLUTION | Freq: Four times a day (QID) | RESPIRATORY_TRACT | Status: DC | PRN

## 2013-10-22 MED ORDER — BUDESONIDE-FORMOTEROL FUMARATE 160-4.5 MCG/ACT IN AERO
2.0000 | INHALATION_SPRAY | Freq: Two times a day (BID) | RESPIRATORY_TRACT | Status: DC
  Administered 2013-10-22 – 2013-10-25 (×7): 2 via RESPIRATORY_TRACT
  Filled 2013-10-22: qty 6

## 2013-10-22 MED ORDER — TRANEXAMIC ACID 100 MG/ML IV SOLN: 1000.0000 mg | INTRAVENOUS | Status: DC

## 2013-10-22 MED ORDER — KETOROLAC TROMETHAMINE 15 MG/ML IJ SOLN
15.0000 mg | Freq: Four times a day (QID) | INTRAMUSCULAR | Status: AC
  Administered 2013-10-22 – 2013-10-23 (×4): 15 mg via INTRAVENOUS
  Filled 2013-10-22 (×4): qty 1

## 2013-10-22 MED ORDER — LACTATED RINGERS IV SOLN
INTRAVENOUS | Status: DC
Start: 2013-10-22 — End: 2013-10-22
  Administered 2013-10-22: 15:00:00 via INTRAVENOUS
  Administered 2013-10-22: 1000 mL via INTRAVENOUS

## 2013-10-22 MED ORDER — CLINDAMYCIN PHOSPHATE 600 MG/50ML IV SOLN
600.0000 mg | Freq: Four times a day (QID) | INTRAVENOUS | Status: AC
  Administered 2013-10-22 (×2): 600 mg via INTRAVENOUS
  Filled 2013-10-22 (×2): qty 50

## 2013-10-22 MED ORDER — PROPOFOL INFUSION 10 MG/ML OPTIME
INTRAVENOUS | Status: DC | PRN
Start: 2013-10-22 — End: 2013-10-22
  Administered 2013-10-22 (×2): 100 ug/kg/min via INTRAVENOUS

## 2013-10-22 MED ORDER — ACETAMINOPHEN 650 MG RE SUPP: 650.0000 mg | Freq: Four times a day (QID) | RECTAL | Status: DC | PRN

## 2013-10-22 MED ORDER — OXYCODONE HCL 5 MG/5ML PO SOLN
5.0000 mg | Freq: Once | ORAL | Status: DC | PRN
  Filled 2013-10-22: qty 5

## 2013-10-22 MED ORDER — PHENOL 1.4 % MT LIQD: 1.0000 | OROMUCOSAL | Status: DC | PRN

## 2013-10-22 MED ORDER — FENTANYL CITRATE 0.05 MG/ML IJ SOLN
50.0000 ug | INTRAMUSCULAR | Status: DC | PRN
  Administered 2013-10-22 (×2): 50 ug via INTRAVENOUS

## 2013-10-22 MED ORDER — KETAMINE HCL 10 MG/ML IJ SOLN
INTRAMUSCULAR | Status: DC | PRN
  Administered 2013-10-22 (×2): 30 mg via INTRAVENOUS

## 2013-10-22 MED ORDER — FERROUS SULFATE 325 (65 FE) MG PO TABS
325.0000 mg | ORAL_TABLET | Freq: Three times a day (TID) | ORAL | Status: DC
  Administered 2013-10-23 – 2013-10-26 (×11): 325 mg via ORAL
  Filled 2013-10-22 (×14): qty 1

## 2013-10-22 SURGICAL SUPPLY — 39 items
BLADE SAW SGTL 18X1.27X75 (BLADE) ×2 IMPLANT
BLADE SAW SGTL 18X1.27X75MM (BLADE) ×1
CAPT HIP PF COP ×3 IMPLANT
CELLS DAT CNTRL 66122 CELL SVR (MISCELLANEOUS) ×1 IMPLANT
COVER PERINEAL POST (MISCELLANEOUS) ×3 IMPLANT
DRAPE C-ARM 42X120 X-RAY (DRAPES) ×3 IMPLANT
DRAPE STERI IOBAN 125X83 (DRAPES) ×3 IMPLANT
DRAPE U-SHAPE 47X51 STRL (DRAPES) ×9 IMPLANT
DRSG AQUACEL AG ADV 3.5X10 (GAUZE/BANDAGES/DRESSINGS) ×3 IMPLANT
DURAPREP 26ML APPLICATOR (WOUND CARE) ×3 IMPLANT
ELECT BLADE TIP CTD 4 INCH (ELECTRODE) ×3 IMPLANT
ELECT REM PT RETURN 9FT ADLT (ELECTROSURGICAL) ×3
ELECTRODE REM PT RTRN 9FT ADLT (ELECTROSURGICAL) ×1 IMPLANT
FACESHIELD WRAPAROUND (MASK) ×12 IMPLANT
GAUZE XEROFORM 1X8 LF (GAUZE/BANDAGES/DRESSINGS) ×3 IMPLANT
GLOVE BIO SURGEON STRL SZ7.5 (GLOVE) ×6 IMPLANT
GLOVE BIOGEL PI IND STRL 8 (GLOVE) ×2 IMPLANT
GLOVE BIOGEL PI INDICATOR 8 (GLOVE) ×4
GLOVE ECLIPSE 8.0 STRL XLNG CF (GLOVE) ×3 IMPLANT
GOWN SPEC L3 XXLG W/TWL (GOWN DISPOSABLE) ×3 IMPLANT
GOWN STRL REUS W/TWL XL LVL3 (GOWN DISPOSABLE) ×9 IMPLANT
HANDPIECE INTERPULSE COAX TIP (DISPOSABLE) ×2
IMMOBILIZER KNEE 16 (SOFTGOODS) ×3 IMPLANT
IMMOBILIZER KNEE 20 (SOFTGOODS) ×3
IMMOBILIZER KNEE 20 THIGH 36 (SOFTGOODS) ×1 IMPLANT
KIT BASIN OR (CUSTOM PROCEDURE TRAY) ×3 IMPLANT
PACK TOTAL JOINT (CUSTOM PROCEDURE TRAY) ×3 IMPLANT
RTRCTR WOUND ALEXIS 18CM MED (MISCELLANEOUS) ×3
SET HNDPC FAN SPRY TIP SCT (DISPOSABLE) ×1 IMPLANT
STAPLER VISISTAT 35W (STAPLE) ×3 IMPLANT
SUT ETHIBOND NAB CT1 #1 30IN (SUTURE) ×3 IMPLANT
SUT MNCRL AB 4-0 PS2 18 (SUTURE) ×3 IMPLANT
SUT VIC AB 0 CT1 36 (SUTURE) ×3 IMPLANT
SUT VIC AB 1 CT1 36 (SUTURE) ×3 IMPLANT
SUT VIC AB 2-0 CT1 27 (SUTURE) ×4
SUT VIC AB 2-0 CT1 TAPERPNT 27 (SUTURE) ×2 IMPLANT
TOWEL OR 17X26 10 PK STRL BLUE (TOWEL DISPOSABLE) ×3 IMPLANT
TOWEL OR NON WOVEN STRL DISP B (DISPOSABLE) ×3 IMPLANT
TRAY FOLEY CATH 14FRSI W/METER (CATHETERS) ×3 IMPLANT

## 2013-10-22 NOTE — Progress Notes (Signed)
X-ray results noted 

## 2013-10-22 NOTE — Anesthesia Preprocedure Evaluation (Addendum)
Anesthesia Evaluation  Patient identified by MRN, date of birth, ID band Patient awake    Reviewed: Allergy & Precautions, H&P , NPO status , Patient's Chart, lab work & pertinent test results  Airway Mallampati: II TM Distance: >3 FB Neck ROM: Full    Dental  (+) Dental Advisory Given   Pulmonary asthma , Current Smoker,  breath sounds clear to auscultation        Cardiovascular hypertension, Pt. on medications negative cardio ROS  Rhythm:Regular Rate:Normal     Neuro/Psych negative neurological ROS  negative psych ROS   GI/Hepatic negative GI ROS, Neg liver ROS,   Endo/Other  negative endocrine ROS  Renal/GU negative Renal ROS     Musculoskeletal negative musculoskeletal ROS (+)   Abdominal   Peds  Hematology negative hematology ROS (+)   Anesthesia Other Findings   Reproductive/Obstetrics negative OB ROS                          Anesthesia Physical Anesthesia Plan  ASA: II  Anesthesia Plan: Spinal   Post-op Pain Management:    Induction:   Airway Management Planned:   Additional Equipment:   Intra-op Plan:   Post-operative Plan:   Informed Consent: I have reviewed the patients History and Physical, chart, labs and discussed the procedure including the risks, benefits and alternatives for the proposed anesthesia with the patient or authorized representative who has indicated his/her understanding and acceptance.   Dental advisory given  Plan Discussed with: CRNA  Anesthesia Plan Comments:        Anesthesia Quick Evaluation

## 2013-10-22 NOTE — Transfer of Care (Signed)
Immediate Anesthesia Transfer of Care Note  Patient: Lisa [name]  Procedure(s) Performed: Procedure(s): RIGHT TOTAL HIP ARTHROPLASTY ANTERIOR APPROACH (Right)  Patient Location: PACU  Anesthesia Type:Spinal  Level of Consciousness: awake, alert , oriented and patient cooperative  Airway & Oxygen Therapy: Patient Spontanous Breathing and Patient connected to face mask oxygen  Post-op Assessment: Report given to PACU RN and Post -op Vital signs reviewed and stable  Post vital signs: stable  Complications: No apparent anesthesia complications  T 10 spinal level

## 2013-10-22 NOTE — Anesthesia Procedure Notes (Signed)
Spinal  Patient location during procedure: OR Staffing Anesthesiologist: Nolon Nations R Performed by: anesthesiologist  Preanesthetic Checklist Completed: patient identified, site marked, surgical consent, pre-op evaluation, timeout performed, IV checked, risks and benefits discussed and monitors and equipment checked Spinal Block Patient position: right lateral decubitus Prep: ChloraPrep Patient monitoring: heart rate, continuous pulse ox and blood pressure Approach: left paramedian Location: L2-3 Injection technique: single-shot Needle Needle type: Sprotte  Needle gauge: 24 G Needle length: 9 cm Assessment Sensory level: T6 Additional Notes Expiration date of kit checked and confirmed. Patient tolerated procedure well, without complications.

## 2013-10-22 NOTE — Progress Notes (Signed)
Portable AP Pelvis and Lateral Right Hip X-rays done. 

## 2013-10-22 NOTE — Progress Notes (Signed)
Dr. Renold DonGermeroth made aware of patient's spinal levels and blood pressures- O.K. To go to floor.

## 2013-10-22 NOTE — Anesthesia Postprocedure Evaluation (Signed)
Anesthesia Post Note  Patient: Cherylin MylarLatoscha R Gillson  Procedure(s) Performed: Procedure(s) (LRB): RIGHT TOTAL HIP ARTHROPLASTY ANTERIOR APPROACH (Right)  Anesthesia type: Spinal  Patient location: PACU  Post pain: Pain level controlled  Post assessment: Post-op Vital signs reviewed  Last Vitals: BP 114/66  Pulse 87  Temp(Src) 36.4 C (Oral)  Resp 19  SpO2 95%  LMP 10/11/2013  Post vital signs: Reviewed  Level of consciousness: sedated  Complications: No apparent anesthesia complications

## 2013-10-22 NOTE — Progress Notes (Signed)
PT Cancellation Note  Patient Details Name: Cherylin MylarLatoscha R Voris MRN: [id number] DOB: [birth year]   Cancelled Treatment:     POD 0 eval deferred at request of RN 2* decreased BP and elevated pain level.  Will follow in am.   BRADSHAW,HUNTER 10/22/2013, 3:59 PM

## 2013-10-22 NOTE — Brief Op Note (Signed)
10/22/2013  12:12 PM  PATIENT:  Lake BellsLatoscha R Brasington  43 y.o. female  PRE-OPERATIVE DIAGNOSIS:  avascular necrosis right hip  POST-OPERATIVE DIAGNOSIS:  avascular necrosis right hip  PROCEDURE:  Procedure(s): RIGHT TOTAL HIP ARTHROPLASTY ANTERIOR APPROACH (Right)  SURGEON:  Surgeon(s) and Role:    * Kathryne Hitchhristopher Y Blackman, MD - Primary  PHYSICIAN ASSISTANT: Rexene EdisonGil Clark, PA-C  ANESTHESIA:   spinal  EBL:  Total I/O In: 2000 [I.V.:2000] Out: 725 [Urine:225; Blood:500]  BLOOD ADMINISTERED:none  DRAINS: none   LOCAL MEDICATIONS USED:  NONE  SPECIMEN:  No Specimen  DISPOSITION OF SPECIMEN:  N/A  COUNTS:  YES  TOURNIQUET:  * No tourniquets in log *  DICTATION: .Other Dictation: Dictation Number [id number]  PLAN OF CARE: Admit to inpatient   PATIENT DISPOSITION:  PACU - hemodynamically stable.   Delay start of Pharmacological VTE agent (>24hrs) due to surgical blood loss or risk of bleeding: no

## 2013-10-22 NOTE — H&P (Signed)
TOTAL HIP ADMISSION H&P  Patient is admitted for right total hip arthroplasty.  Subjective:  Chief Complaint: right hip pain  HPI: Lisa [name], 43 y.o. female, has a history of pain and functional disability in the right hip(s) due to avascular necrosis and patient has failed non-surgical conservative treatments for greater than 12 weeks to include NSAID's and/or analgesics, corticosteriod injections, flexibility and strengthening excercises, use of assistive devices and activity modification.  Onset of symptoms was abrupt starting 3 years ago with rapidlly worsening course since that time.The patient noted no past surgery on the right hip(s).  Patient currently rates pain in the right hip at 10 out of 10 with activity. Patient has night pain, worsening of pain with activity and weight bearing, trendelenberg gait, pain that interfers with activities of daily living, pain with passive range of motion and crepitus. Patient has evidence of subchondral cysts, periarticular osteophytes and joint space narrowing by imaging studies. This condition presents safety issues increasing the risk of falls.  There is no current active infection.  Patient Active Problem List   Diagnosis Date Noted  . Avascular necrosis of bone of right hip 10/22/2013  . Spontaneous pneumothorax 04/21/2012  . Asthma 04/21/2012  . Smoker 04/21/2012  . Hypertension 04/21/2012   Past Medical History  Diagnosis Date  . Hypertension   . Asthma   . H/O: pneumothorax 2013  . Arthritis     Past Surgical History  Procedure Laterality Date  . Mouth surgery    . Ankle surgery    . Cesarean section      No prescriptions prior to admission   Allergies  Allergen Reactions  . Penicillins Swelling and Rash    History  Substance Use Topics  . Smoking status: Current Every Day Smoker -- 0.25 packs/day for 18 years  . Smokeless tobacco: Not on file  . Alcohol Use: Yes     Comment: ocassional    No family history on  file.   Review of Systems  Musculoskeletal: Positive for joint pain.  All other systems reviewed and are negative.   Objective:  Physical Exam  Constitutional: She is oriented to person, place, and time. She appears well-developed and well-nourished.  HENT:  Head: Normocephalic and atraumatic.  Eyes: EOM are normal. Pupils are equal, round, and reactive to light.  Neck: Normal range of motion. Neck supple.  Cardiovascular: Normal rate and regular rhythm.   Respiratory: Effort normal and breath sounds normal.  GI: Soft. Bowel sounds are normal.  Musculoskeletal:       Right hip: She exhibits decreased range of motion, decreased strength, bony tenderness and crepitus.  Neurological: She is alert and oriented to person, place, and time.  Skin: Skin is warm and dry.  Psychiatric: She has a normal mood and affect.    Vital signs in last 24 hours:    Labs:   Estimated body mass index is 25.97 kg/(m^2) as calculated from the following:   Height as of 04/21/12: 5\' 2"  (1.575 m).   Weight as of 04/21/12: 64.411 kg (142 lb).   Imaging Review Plain radiographs demonstrate severe avascular necrosis of the right hip(s). The bone quality appears to be good for age and reported activity level.  Assessment/Plan:  End stage arthritis, right hip(s)  The patient history, physical examination, clinical judgement of the provider and imaging studies are consistent with end stage degenerative joint disease of the right hip(s) and total hip arthroplasty is deemed medically necessary. The treatment options including medical  management, injection therapy, arthroscopy and arthroplasty were discussed at length. The risks and benefits of total hip arthroplasty were presented and reviewed. The risks due to aseptic loosening, infection, stiffness, dislocation/subluxation,  thromboembolic complications and other imponderables were discussed.  The patient acknowledged the explanation, agreed to proceed with  the plan and consent was signed. Patient is being admitted for inpatient treatment for surgery, pain control, PT, OT, prophylactic antibiotics, VTE prophylaxis, progressive ambulation and ADL's and discharge planning.The patient is planning to be discharged home with home health services

## 2013-10-23 LAB — CBC
HCT: 25.4 % — ABNORMAL LOW (ref 36.0–46.0)
Hemoglobin: 8.7 g/dL — ABNORMAL LOW (ref 12.0–15.0)
MCH: 28.8 pg (ref 26.0–34.0)
MCHC: 34.3 g/dL (ref 30.0–36.0)
MCV: 84.1 fL (ref 78.0–100.0)
Platelets: 180 10*3/uL (ref 150–400)
RBC: 3.02 MIL/uL — ABNORMAL LOW (ref 3.87–5.11)
RDW: 14.4 % (ref 11.5–15.5)
WBC: 8.2 10*3/uL (ref 4.0–10.5)

## 2013-10-23 LAB — BASIC METABOLIC PANEL
BUN: 13 mg/dL (ref 6–23)
CO2: 28 mEq/L (ref 19–32)
Calcium: 8.1 mg/dL — ABNORMAL LOW (ref 8.4–10.5)
Chloride: 97 mEq/L (ref 96–112)
Creatinine, Ser: 0.73 mg/dL (ref 0.50–1.10)
Glucose, Bld: 122 mg/dL — ABNORMAL HIGH (ref 70–99)
POTASSIUM: 3.4 meq/L — AB (ref 3.7–5.3)
SODIUM: 134 meq/L — AB (ref 137–147)

## 2013-10-23 NOTE — Op Note (Signed)
NAMMarland Kitchen:  Lisa [name], Lisa [name]             ACCOUNT NO.:  [id number]  MEDICAL RECORD NO.:  [id number]  LOCATION:  1313                         [facility]  PHYSICIAN:  Vanita Pandahristopher Y. 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  PROCEDURE:  Right total hip arthroplasty through direct anterior approach.  SURGEON:  Doneen Poissonhristopher Blackman, M.D.  ASSISTANT:  Richardean CanalGilbert Clark, PA-C.  ANESTHESIA:  Spinal.  ANTIBIOTICS:  900 mg of IV clindamycin.  BLOOD LOSS:  500-900 mL.  COMPLICATIONS:  None.  IMPLANTS:  DePuy Sector Gription acetabular component size 48 with a single screw and apex hole eliminator guide, size 32+ 4 neutral polyethylene liner, size 9 Corail femoral component with varus offset (KLA), size 32+ 1 ceramic hip ball.  INDICATIONS:  Lisa [name] is a 43 year old female with known severe avascular necrosis of her right hip.  I have seen her for many years now, and over time, she started to develop a severe hip flexion contracture.  It has gotten to the point where she cannot even straighten her knee and her pain is severe and daily.  We have recommended a total hip arthroplasty through direct anterior approach. We understand this may be quite difficult due to her flexed position and are shortening for so long now.  She does agree to proceed with surgery and understands the risks of acute blood loss anemia, nerve and vessel injury, fracture, infection, DVT, and dislocation.  She understands the goals of surgery, decreased pain, improved mobility, and overall improved quality of life.  PROCEDURE DESCRIPTION:  The patient was brought to the operating room and spinal anesthesia was obtained __________ on  her side.  Once we rolled her back into the supine position, her hip had severe flexion contracture, it took some actually manipulation under spinal anesthesia just to get her leg even in a more straight position to be able to proceed with a direct anterior approach, noting that she was going to be still significantly short in length for difficult case.  We had to put traction boots on and place a Foley catheter in place.  We placed her neck supine on the Hana fracture table with the perineal post in place and both legs in __________ skeletal traction devices.  Her right operative hip was then prepped and draped with DuraPrep and sterile drapes.  A time-out was called to identify correct patient, correct right hip.  We then made an incision inferior and posterior to the anterior superior iliac spine and carried this obliquely down the leg. We were able to dissect down the tensor fascia lata muscle and divide the tensor fascia longitudinally.  We found her to be still quite tight. We were able to make our femoral neck cut proximal to lesser trochanter with an oscillating saw and completed this with an osteotome and removed the femoral head and found to have complete collapse and devoid of cartilage and had findings consistent with avascular necrosis.  Her acetabulum had  significant changes as well.  We then began reaming under direct visualization from a size 42 up to a size 48 with the last reamer also placed under direct fluoroscopy __________ inclination and anteversion.  We then placed the real DePuy Sector Gription acetabular component size 48, the apex hole eliminator guide and a single screw. We then placed the real 32+ 4 neutral polyethylene liner for that size acetabular component.  Attention was then turned to the femur.  Legs were rotated to 100 degrees, extended and adducted.  We were able to use a box cutting osteotome into femoral canal and a rongeur to lateralize. We  broached up to a size 9 broach.  With the 9 broach in place and a varus offset neck, we had to place a 32+ 1 hip ball.  It took quite some effort reducing the acetabulum, and then we were able to reduce it.  She still remained short, but she was very stable even past 100 degrees of external rotation due to her tightened tissue.  We dislocated the hip and removed the trial components.  We then placed the real Corail femoral component with varus offset and the real 32+ 1 ceramic hip ball and __________ the acetabulum and again it was stable.  Her knee still was flexed due to her issues with her hip.  We then irrigated the soft tissues with normal saline solution.  We closed the iliotibial band with an interrupted #1 Vicryl suture followed by 0 Vicryl in the deep tissue, 2-0 Vicryl in subcutaneous tissue and staples on the skin.  A well- padded sterile dressing was applied.  We also placed her in a knee immobilizer, not to protect the hip, but more so to keep her knee from flexing up due to her severe contracture from her hip.  She was taken to recovery room in stable condition.     Vanita Pandahristopher Y. Magnus IvanBlackman, M.D.     CYB/MEDQ  D:  10/22/2013  T:  10/22/2013  Job:  [id number]

## 2013-10-23 NOTE — Evaluation (Signed)
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  Pt would benefit from follow up rehab at SNF level to maximize IND prior to return home.    Follow Up Recommendations SNF    Equipment Recommendations  Rolling walker with 5" wheels    Recommendations for Other Services OT consult     Precautions / Restrictions Precautions Precautions: Fall Required Braces or Orthoses: Knee Immobilizer - Right Knee Immobilizer - Right: On at all times Restrictions Weight Bearing Restrictions: No Other Position/Activity Restrictions: WBAT      Mobility  Bed Mobility Overal bed mobility: Needs Assistance Bed Mobility: Supine to Sit     Supine to sit: HOB elevated;Mod assist     General bed mobility comments: cues for use of [name]; assist with R LE  Transfers Overall transfer level: Needs assistance Equipment used: Rolling walker (2 wheeled) Transfers: Sit to/from Stand Sit to Stand: +2 safety/equipment;Min assist;Mod assist         General transfer comment: cues for LE management and use of UEs to self assist  Ambulation/Gait Ambulation/Gait assistance: +2 safety/equipment;Min assist;Mod assist Ambulation Distance (Feet): 8 Feet Assistive device: Rolling walker (2 wheeled) Gait Pattern/deviations: Step-to pattern;Decreased step length - right;Decreased step length - left;Trunk flexed Gait velocity: decr   General Gait Details: cues for sequence and posture.  Min WB tolerated on R 2* pain and shortened position of R LE  Stairs            Wheelchair Mobility    Modified Rankin (Stroke Patients Only)       Balance                                             Pertinent Vitals/Pain 8/10;  premed, RN aware, ice packs provided    Home Living Family/patient expects to be discharged to:: Skilled nursing facility Living Arrangements: Other relatives               Additional Comments: Pt lives with 43 yr old aunt    Prior Function Level of Independence: Independent with assistive device(s)         Comments: pt used crutches prior to admit     Hand Dominance   Dominant Hand: Right    Extremity/Trunk Assessment   Upper Extremity Assessment: Overall WFL for tasks assessed           Lower Extremity Assessment: RLE deficits/detail RLE Deficits / Details: KI in place    Cervical / Trunk Assessment: Normal  Communication   Communication: No difficulties  Cognition Arousal/Alertness: Awake/alert Behavior During Therapy: WFL for tasks assessed/performed Overall Cognitive Status: Within Functional Limits for tasks assessed                      General Comments      Exercises        Assessment/Plan    PT Assessment Patient needs continued PT services  PT Diagnosis Difficulty walking   PT Problem List Decreased strength;Decreased range of motion;Decreased activity tolerance;Decreased mobility;Decreased knowledge of use of DME  PT Treatment Interventions DME instruction;Gait training;Stair training;Functional mobility training;Therapeutic activities;Therapeutic exercise;Patient/family  education   PT Goals (Current goals can be found in the Care Plan section) Acute Rehab PT Goals Patient Stated Goal: Walk without pain PT Goal Formulation: With patient Time For Goal Achievement: 10/30/13 Potential to Achieve Goals: Good    Frequency 7X/week   Barriers to discharge        Co-evaluation               End of Session Equipment Utilized During Treatment: Gait belt;Right knee immobilizer Activity Tolerance: Patient limited by pain (and muscle spasms) Patient left: in chair;with call bell/phone within reach;with nursing/sitter in  room Nurse Communication: Mobility status         Time: [id number] PT Time Calculation (min): 27 min   Charges:   PT Evaluation $Initial PT Evaluation Tier I: 1 Procedure PT Treatments $Gait Training: 8-22 mins   PT G Codes:          BRADSHAW,HUNTER 10/23/2013, 4:17 PM

## 2013-10-23 NOTE — Progress Notes (Signed)
Subjective: 1 Day Post-Op Procedure(s) (LRB): RIGHT TOTAL HIP ARTHROPLASTY ANTERIOR APPROACH (Right) Patient reports pain as moderate.  Acute blood loss anemia from surgery.  Vitals stable thus far.  Objective: Vital signs in last 24 hours: Temp:  [97.5 F (36.4 C)-100.8 F (38.2 C)] 100.8 F (38.2 C) (06/27 0459) Pulse Rate:  [84-116] 92 (06/27 0459) Resp:  [16-25] 18 (06/27 0459) BP: (92-153)/(52-87) 121/72 mmHg (06/27 0459) SpO2:  [92 %-100 %] 100 % (06/27 0459) Weight:  [65.772 kg (145 lb)] 65.772 kg (145 lb) (06/26 1807)  Intake/Output from previous day: 06/26 0701 - 06/27 0700 In: 4440 [P.O.:240; I.V.:4040; IV Piggyback:160] Out: 1925 [Urine:1375; Blood:550] Intake/Output this shift: Total I/O In: 240 [P.O.:240] Out: -    Recent Labs  10/23/13 0505  HGB 8.7*    Recent Labs  10/23/13 0505  WBC 8.2  RBC 3.02*  HCT 25.4*  PLT 180    Recent Labs  10/23/13 0505  NA 134*  K 3.4*  CL 97  CO2 28  BUN 13  CREATININE 0.73  GLUCOSE 122*  CALCIUM 8.1*   No results found for this basename: LABPT, INR,  in the last 72 hours  Sensation intact distally Intact pulses distally Dorsiflexion/Plantar flexion intact Incision: scant drainage  Assessment/Plan: 1 Day Post-Op Procedure(s) (LRB): RIGHT TOTAL HIP ARTHROPLASTY ANTERIOR APPROACH (Right) Up with therapy Has knee immobilizer on the right side due to her wanting to keep her knee and hip in a flexed position pre-op.  This created a significant flexion contracture. Monitor H/H  BLACKMAN,CHRISTOPHER Y 10/23/2013, 9:55 AM

## 2013-10-24 LAB — CBC
HEMATOCRIT: 25.3 % — AB (ref 36.0–46.0)
HEMOGLOBIN: 8.9 g/dL — AB (ref 12.0–15.0)
MCH: 29.1 pg (ref 26.0–34.0)
MCHC: 35.2 g/dL (ref 30.0–36.0)
MCV: 82.7 fL (ref 78.0–100.0)
Platelets: 193 10*3/uL (ref 150–400)
RBC: 3.06 MIL/uL — AB (ref 3.87–5.11)
RDW: 14.4 % (ref 11.5–15.5)
WBC: 13.8 10*3/uL — AB (ref 4.0–10.5)

## 2013-10-24 MED ORDER — POTASSIUM CHLORIDE CRYS ER 20 MEQ PO TBCR
40.0000 meq | EXTENDED_RELEASE_TABLET | Freq: Two times a day (BID) | ORAL | Status: AC
  Administered 2013-10-24 (×2): 40 meq via ORAL
  Filled 2013-10-24 (×2): qty 2

## 2013-10-24 NOTE — Progress Notes (Signed)
Clinical Social Work Department CLINICAL SOCIAL WORK PLACEMENT NOTE 10/24/2013  Patient:  Lisa [name],[name]  Account Number:  [id number] Admit date:  10/22/2013  Clinical Social Worker:  Doroteo GlassmanAMANDA SIMPSON, LCSWA  Date/time:  10/24/2013 02:42 PM  Clinical Social Work is seeking post-discharge placement for this patient at the following level of care:   SKILLED NURSING   (*CSW will update this form in Epic as items are completed)   10/24/2013  Patient/family provided with [facility] Department of Clinical Social Work's list of facilities offering this level of care within the geographic area requested by the patient (or if unable, by the patient's family).  10/24/2013  Patient/family informed of their freedom to choose among providers that offer the needed level of care, that participate in Medicare, Medicaid or managed care program needed by the patient, have an available bed and are willing to accept the patient.  10/24/2013  Patient/family informed of [facility]' ownership interest in [facility], as well as of the fact that they are under no obligation to receive care at this facility.  PASARR submitted to EDS on 10/24/2013 PASARR number received on 10/24/2013  FL2 transmitted to all facilities in geographic area requested by pt/family on  10/24/2013 FL2 transmitted to all facilities within larger geographic area on   Patient informed that his/her managed care company has contracts with or will negotiate with  certain facilities, including the following:     Patient/family informed of bed offers received:   Patient chooses bed at  Physician recommends and patient chooses bed at    Patient to be transferred to  on   Patient to be transferred to facility by  Patient and family notified of transfer on  Name of family member notified:    The following physician request were entered in Epic:   Additional Comments:  Providence CrosbyAmanda Simpson, LCSW Clinical Social  Work [phone]

## 2013-10-24 NOTE — Progress Notes (Signed)
Physical Therapy Treatment Patient Details Name: Lisa [name] Neu MRN: [id number] DOB: 1970-12-30 Today's Date: 10/24/2013    History of Present Illness      PT Comments    Increased activity tolerance with IV pain meds at beginning of session  Follow Up Recommendations  SNF     Equipment Recommendations  Rolling walker with 5" wheels    Recommendations for Other Services OT consult     Precautions / Restrictions Precautions Precautions: Fall Required Braces or Orthoses: Knee Immobilizer - Right Knee Immobilizer - Right: On at all times Restrictions Weight Bearing Restrictions: No Other Position/Activity Restrictions: WBAT    Mobility  Bed Mobility                  Transfers Overall transfer level: Needs assistance Equipment used: Rolling walker (2 wheeled) Transfers: Sit to/from Stand Sit to Stand: Min assist;Mod assist         General transfer comment: cues for LE management and use of UEs to self assist  Ambulation/Gait Ambulation/Gait assistance: Min assist Ambulation Distance (Feet): 49 Feet Assistive device: Rolling walker (2 wheeled) Gait Pattern/deviations: Step-to pattern;Decreased step length - right;Decreased step length - left;Shuffle;Trunk flexed Gait velocity: decr   General Gait Details: cues for sequence and posture.  Min WB tolerated on [name] 2* pain and shortened position of [name] LE   Stairs            Wheelchair Mobility    Modified Rankin (Stroke Patients Only)       Balance                                    Cognition Arousal/Alertness: Awake/alert Behavior During Therapy: WFL for tasks assessed/performed Overall Cognitive Status: Within Functional Limits for tasks assessed                      Exercises      General Comments        Pertinent Vitals/Pain 7/10; IV pain meds at beginning of session; ice packs provided    Home Living                      Prior Function             PT Goals (current goals can now be found in the care plan section) Acute Rehab PT Goals Patient Stated Goal: Walk without pain PT Goal Formulation: With patient Time For Goal Achievement: 10/30/13 Potential to Achieve Goals: Good Progress towards PT goals: Progressing toward goals    Frequency  7X/week    PT Plan Current plan remains appropriate    Co-evaluation             End of Session Equipment Utilized During Treatment: Gait belt;Right knee immobilizer Activity Tolerance: Patient limited by pain;Patient tolerated treatment well Patient left: in chair;with call bell/phone within reach;with nursing/sitter in room     Time: 1143-1205 PT Time Calculation (min): 22 min  Charges:  $Gait Training: 8-22 mins                    G Codes:      Lisa [name],Lisa [name] 10/24/2013, 1:01 PM

## 2013-10-24 NOTE — Progress Notes (Signed)
OT Cancellation Note  Patient Details Name: Cherylin MylarLatoscha R Soots MRN: [id number] DOB: 1971-04-12   Cancelled Treatment:    Reason Eval/Treat Not Completed: Pain limiting ability to participate. Will recheck on pt later in day or next day . Did explain role of OT. Pt does want some sort of rehab prior to DC home.  Alba CoryREDDING, Augustino Savastano D 10/24/2013, 9:04 AM

## 2013-10-24 NOTE — Progress Notes (Signed)
Subjective: 2 Days Post-Op Procedure(s) (LRB): RIGHT TOTAL HIP ARTHROPLASTY ANTERIOR APPROACH (Right) Patient reports pain as severe.  Not progressing as fast as she would like with PT.  Objective: Vital signs in last 24 hours: Temp:  [98.2 F (36.8 C)-99.2 F (37.3 C)] 99.2 F (37.3 C) (06/28 0558) Pulse Rate:  [99-131] 131 (06/28 0558) Resp:  [16-20] 20 (06/28 0558) BP: (101-151)/(63-91) 151/91 mmHg (06/28 0558) SpO2:  [87 %-100 %] 98 % (06/28 0558)  Intake/Output from previous day: 06/27 0701 - 06/28 0700 In: 866 [P.O.:600; I.V.:266] Out: 850 [Urine:850] Intake/Output this shift:     Recent Labs  10/23/13 0505 10/24/13 0500  HGB 8.7* 8.9*    Recent Labs  10/23/13 0505 10/24/13 0500  WBC 8.2 13.8*  RBC 3.02* 3.06*  HCT 25.4* 25.3*  PLT 180 193    Recent Labs  10/23/13 0505  NA 134*  K 3.4*  CL 97  CO2 28  BUN 13  CREATININE 0.73  GLUCOSE 122*  CALCIUM 8.1*   No results found for this basename: LABPT, INR,  in the last 72 hours  Neurovascular intact Sensation intact distally Intact pulses distally Dorsiflexion/Plantar flexion intact Incision: dressing C/D/I Compartment soft  Assessment/Plan: 2 Days Post-Op Procedure(s) (LRB): RIGHT TOTAL HIP ARTHROPLASTY ANTERIOR APPROACH (Right) Up with PT Monitor HgB Hypokalemia wil replace K+  Hyponatremia will monitor  CLARK, GILBERT 10/24/2013, 8:39 AM

## 2013-10-24 NOTE — Progress Notes (Signed)
Clinical Social Work Department BRIEF PSYCHOSOCIAL ASSESSMENT 10/24/2013  Patient:  Lisa [name],Lisa [name]     Account Number:  [id number]     Admit date:  10/22/2013  Clinical Social Worker:  Doroteo GlassmanSIMPSON,AMANDA, LCSWA  Date/Time:  10/24/2013 02:43 PM  Referred by:  Physician  Date Referred:  10/24/2013 Referred for  SNF Placement   Other Referral:   Interview type:  Patient Other interview type:    PSYCHOSOCIAL DATA Living Status:  FAMILY Admitted from facility:   Level of care:   Primary support name:  Merlene PullingAnn [name] Primary support relationship to patient:  CHILD, ADULT Degree of support available:   adequate    CURRENT CONCERNS Current Concerns  Post-Acute Placement   Other Concerns:    SOCIAL WORK ASSESSMENT / PLAN Spoke with Pt about d/c plans.    Pt and CSW discussed PT"s SNF recommendation.  Pt stated that she is in agreement with SNF, as she feels that she needs more PT to get stronger.  Pt stated that she was hoping that PT would make this recommendation.    Pt and CSW talked about the SNF process, particularly with regard to her M'caid.  CSW explained that Pt will have to agree to staying at a SNF for 30 days in order for M'caid to cover it.  Pt aware that if she leaves rehab prior to completing 30 days, she may be billed for the full amount. Pt stated that she understands and has every intention of staying for 30 days or longer.    Pt stated that [place] and she would like to be close to them.  CSW explained that SNFs may be limited due to her insurance.  Pt voiced an understanding.  Pt wants to talk to her family about which facility [place] recommend.    CSW thanked Pt for her time.   Assessment/plan status:  Psychosocial Support/Ongoing Assessment of Needs Other assessment/ plan:   Information/referral to community resources:    PATIENT'S/FAMILY'S RESPONSE TO PLAN OF CARE: Pt was calm, cooperative  and very pleasant. Pt happy to go to SNF and stated, "It was my idea to go.  I really need this."   Providence CrosbyAmanda Simpson, LCSW Clinical Social Work [phone]

## 2013-10-24 NOTE — Care Management Note (Signed)
    Page 1 of 1   10/24/2013     8:00:02 AM CARE MANAGEMENT NOTE 10/24/2013  Patient:  Lisa [name],Lisa [name]   Account Number:  [id number]  Date Initiated:  10/23/2013  Documentation initiated by:  [facility]SARAH  Subjective/Objective Assessment:   adm: right total hip arthroplasty     Action/Plan:   discharge plannned for SNF for rehab   Anticipated DC Date:  10/25/2013   Anticipated DC Plan:  SKILLED NURSING FACILITY      DC Planning Services  CM consult      Choice offered to / List presented to:             Status of service:  Completed, signed off Medicare Important Message given?   (If response is "NO", the following Medicare IM given date fields will be blank) Date Medicare IM given:   Date Additional Medicare IM given:    Discharge Disposition:  SKILLED NURSING FACILITY  Per UR Regulation:    If discussed at Long Length of Stay Meetings, dates discussed:    Comments:  10/23/13 14:15 CM reviewed and confirmed with pt; pt will rehab in SNF.  CSW to arrange.  No other CM needs were communicated.  Freddy JakschSarah Jeffries, BSN, CM [phone].

## 2013-10-24 NOTE — Progress Notes (Signed)
Physical Therapy Treatment Patient Details Name: Lisa [name] MRN: [id number] DOB: 1970-06-05 Today's Date: 10/24/2013    History of Present Illness      PT Comments    Pt motivated but ltd by c/o pain and muscle spasm.  Follow Up Recommendations  SNF     Equipment Recommendations  Rolling walker with 5" wheels    Recommendations for Other Services OT consult     Precautions / Restrictions Precautions Precautions: Fall Required Braces or Orthoses: Knee Immobilizer - Right Knee Immobilizer - Right: On at all times Restrictions Weight Bearing Restrictions: No Other Position/Activity Restrictions: WBAT    Mobility  Bed Mobility               General bed mobility comments: Pt declines back to bed  Transfers Overall transfer level: Needs assistance Equipment used: Rolling walker (2 wheeled) Transfers: Sit to/from Stand Sit to Stand: Min assist;Mod assist         General transfer comment: cues for LE management and use of UEs to self assist  Ambulation/Gait Ambulation/Gait assistance: Min assist Ambulation Distance (Feet): 49 Feet Assistive device: Rolling walker (2 wheeled) Gait Pattern/deviations: Step-to pattern;Decreased step length - right;Decreased step length - left;Shuffle;Trunk flexed Gait velocity: decr   General Gait Details: cues for sequence and posture.  Min WB tolerated on R 2* pain and shortened position of R LE   Stairs            Wheelchair Mobility    Modified Rankin (Stroke Patients Only)       Balance                                    Cognition Arousal/Alertness: Awake/alert Behavior During Therapy: WFL for tasks assessed/performed Overall Cognitive Status: Within Functional Limits for tasks assessed                      Exercises      General Comments        Pertinent Vitals/Pain 7/10; premed, ice packs provided    Home Living                      Prior Function             PT Goals (current goals can now be found in the care plan section) Acute Rehab PT Goals Patient Stated Goal: Walk without pain PT Goal Formulation: With patient Time For Goal Achievement: 10/30/13 Potential to Achieve Goals: Good Progress towards PT goals: Progressing toward goals    Frequency  7X/week    PT Plan Current plan remains appropriate    Co-evaluation             End of Session Equipment Utilized During Treatment: Gait belt;Right knee immobilizer Activity Tolerance: Patient limited by pain;Patient tolerated treatment well Patient left: in chair;with call bell/phone within reach;with nursing/sitter in room     Time: 1440-1500 PT Time Calculation (min): 20 min  Charges:  $Gait Training: 8-22 mins                    G Codes:      BRADSHAW,HUNTER 10/24/2013, 3:22 PM

## 2013-10-25 LAB — CBC
HCT: 24.8 % — ABNORMAL LOW (ref 36.0–46.0)
HEMOGLOBIN: 8.4 g/dL — AB (ref 12.0–15.0)
MCH: 28.4 pg (ref 26.0–34.0)
MCHC: 33.9 g/dL (ref 30.0–36.0)
MCV: 83.8 fL (ref 78.0–100.0)
Platelets: 207 10*3/uL (ref 150–400)
RBC: 2.96 MIL/uL — AB (ref 3.87–5.11)
RDW: 14.6 % (ref 11.5–15.5)
WBC: 12.2 10*3/uL — ABNORMAL HIGH (ref 4.0–10.5)

## 2013-10-25 NOTE — Progress Notes (Signed)
Subjective: 3 Days Post-Op Procedure(s) (LRB): RIGHT TOTAL HIP ARTHROPLASTY ANTERIOR APPROACH (Right) Patient reports pain as moderate.  States she had a d better day yesterday with PT. Having a lot of muscle spasm. Denies any dizziness or lightheadedness.  Objective: Vital signs in last 24 hours: Temp:  [97.4 F (36.3 C)-98.8 F (37.1 C)] 98.4 F (36.9 C) (06/29 0600) Pulse Rate:  [105-128] 117 (06/29 0600) Resp:  [18] 18 (06/29 0600) BP: (111-117)/(72-73) 111/73 mmHg (06/29 0600) SpO2:  [90 %-97 %] 90 % (06/29 0808)  Intake/Output from previous day: 06/28 0701 - 06/29 0700 In: 720 [P.O.:720] Out: -  Intake/Output this shift:     Recent Labs  10/23/13 0505 10/24/13 0500 10/25/13 0545  HGB 8.7* 8.9* 8.4*    Recent Labs  10/24/13 0500 10/25/13 0545  WBC 13.8* 12.2*  RBC 3.06* 2.96*  HCT 25.3* 24.8*  PLT 193 207    Recent Labs  10/23/13 0505  NA 134*  K 3.4*  CL 97  CO2 28  BUN 13  CREATININE 0.73  GLUCOSE 122*  CALCIUM 8.1*   No results found for this basename: LABPT, INR,  in the last 72 hours  Neurovascular intact Sensation intact distally Intact pulses distally Dorsiflexion/Plantar flexion intact Incision: scant drainage Compartment soft  Assessment/Plan: 3 Days Post-Op Procedure(s) (LRB): RIGHT TOTAL HIP ARTHROPLASTY ANTERIOR APPROACH (Right) Plan to discharge to SNF tomorrow OOB with PT today Monitor for symptoms of anemia on iron CLARK, GILBERT 10/25/2013, 8:27 AM

## 2013-10-25 NOTE — Evaluation (Signed)
Occupational Therapy Evaluation Patient Details Name: Lisa [name] MRN: [id number] DOB: [birth year] Today's Date: 10/25/2013    History of Present Illness [name]- anterior   Clinical Impression   Pt with decreased I with ADL activity s/p hip surgery due to problems listed below.Pt will benefit from skilled OT to increase I with ADL activity and return to PLOF. Pt really moitvated to get well and back to her ADL activity and being a mom.    Follow Up Recommendations  SNF    Equipment Recommendations  None recommended by OT    Recommendations for Other Services       Precautions / Restrictions Precautions Precautions: Fall Required Braces or Orthoses: Knee Immobilizer - Right Knee Immobilizer - Right: On at all times Restrictions Weight Bearing Restrictions: No Other Position/Activity Restrictions: WBAT      Mobility Bed Mobility               General bed mobility comments: pt in chair  Transfers Overall transfer level: Needs assistance Equipment used: Rolling walker (2 wheeled)   Sit to Stand: Min assist                   ADL Overall ADL's : Needs assistance/impaired                         Toilet Transfer: Minimal assistance;BSC   Toileting- Clothing Manipulation and Hygiene: Sit to/from stand;Minimal assistance         General ADL Comments: Pt needed increased time due to pain. Pt is doing much better- with pain and tolerance of activity! Pt will benefit from rehab to increase I with ADL activity                    Extremity/Trunk Assessment     Lower Extremity Assessment RLE Deficits / Details: KI in place RLE: Unable to fully assess due to pain   Cervical / Trunk Assessment Cervical / Trunk Assessment: Normal   Communication     Cognition Arousal/Alertness: Awake/alert Behavior During Therapy: WFL for tasks assessed/performed Overall Cognitive Status: Within Functional Limits for tasks assessed                            OT Diagnosis: Generalized weakness      OT Treatment/Interventions: Self-care/ADL training;DME and/or AE instruction;Patient/family education    OT Goals(Current goals can be found in the care plan section) Acute Rehab OT Goals Patient Stated Goal: Walk without pain OT Goal Formulation: With patient Time For Goal Achievement: 11/01/13  OT Frequency: Min 2X/week              End of Session Nurse Communication: Mobility status  Activity Tolerance: Patient tolerated treatment well Patient left: in chair;with call bell/phone within reach   Time: 0830-0930 OT Time Calculation (min): 60 min Charges:  OT General Charges $OT Visit: 1 Procedure OT Evaluation $Initial OT Evaluation Tier I: 1 Procedure OT Treatments $Self Care/Home Management : 38-52 mins G-Codes:    Einar CrowEDDING, Lorraine D 10/25/2013, 9:45 AM

## 2013-10-25 NOTE — Progress Notes (Signed)
Physical Therapy Treatment Patient Details Name: Lisa [name] MRN: [id number] DOB: [birth year] Today's Date: 10/25/2013    History of Present Illness [name]- anterior    PT Comments    POD # 3 pm session.  Pt requested to amb with her crutches, "I have been using them the past 3 years".  Assisted off BSC to amb limited distance in hallway using B crutches.  Pt felt more comfortable. Limited distance due to pain.  Performed TE's then applied ICE.    Follow Up Recommendations  SNF     Equipment Recommendations   (pt prefers to use her crutches)    Recommendations for Other Services       Precautions / Restrictions Precautions Precautions: Fall Required Braces or Orthoses: Knee Immobilizer - Right Knee Immobilizer - Right: On at all times (to decrease knee flexion contracture) Restrictions Weight Bearing Restrictions: No Other Position/Activity Restrictions: WBAT    Mobility  Bed Mobility               General bed mobility comments: pt in chair  Transfers Overall transfer level: Needs assistance Equipment used: Rolling walker (2 wheeled) Transfers: Sit to/from Stand Sit to Stand: Min assist         General transfer comment: cues for LE management and use of UEs to self assist  Ambulation/Gait Ambulation/Gait assistance: Min assist Ambulation Distance (Feet): 22 Feet Assistive device: Crutches Gait Pattern/deviations: Step-to pattern;Decreased step length - right;Decreased stance time - right;Trunk flexed;Narrow base of support Gait velocity: decr   General Gait Details: cues for sequence and posture.  Min WB tolerated on R 2* pain.  75% VC's to increase step length of [name] to stretch R LE/increase R knee extension   Stairs            Wheelchair Mobility    Modified Rankin (Stroke Patients Only)       Balance                                    Cognition                            Exercises   Total Hip  Replacement TE's 10 reps ankle pumps 10 reps knee presses 10 reps heel slides 10 reps SAQ's 10 reps ABD Followed by ICE     General Comments        Pertinent Vitals/Pain Total Hip Replacement TE's 10 reps ankle pumps 10 reps knee presses 10 reps heel slides  AAROM 10 reps SAQ's  AAROM 10 reps ABD AAROM Followed by ICE     Home Living                      Prior Function            PT Goals (current goals can now be found in the care plan section)      Frequency  7X/week    PT Plan      Co-evaluation             End of Session Equipment Utilized During Treatment: Gait belt;Right knee immobilizer Activity Tolerance: Patient limited by pain;Patient tolerated treatment well Patient left: in chair;with call bell/phone within reach;with nursing/sitter in room     Time: 1550-1615 PT Time Calculation (min): 25 min  Charges:  $Gait Training: 8-22 mins $Therapeutic Exercise:  8-22 mins                    G Codes:      Rica Koyanagi  PTA [facility]  Acute  Rehab Pager      [phone]

## 2013-10-25 NOTE — Progress Notes (Signed)
Physical Therapy Treatment Patient Details Name: Lisa [name] MRN: [id number] DOB: [birth year] Today's Date: 10/25/2013    History of Present Illness [name]- anterior    PT Comments    POD # 3 am session.  Pt requested pain meds(Oxy + Dilaudid) prior to session.  Assisted out of recliner to amb limited distance in hallway with focus to increase WBing and heel strike R LE to increase R knee extension/cord stretch.  Pt required increased time and recliner to follow for safety. Performed THR TE's mostly AAROM due to MAX amount pain and "tightness".  Followed by ICE.  Follow Up Recommendations  SNF     Equipment Recommendations  Rolling Walker   Recommendations for Other Services       Precautions / Restrictions Precautions Precautions: Fall Required Braces or Orthoses: Knee Immobilizer - Right Knee Immobilizer - Right: On at all times (to decrease knee flexion contracture) Restrictions Weight Bearing Restrictions: No Other Position/Activity Restrictions: WBAT    Mobility  Bed Mobility               General bed mobility comments: pt in chair  Transfers Overall transfer level: Needs assistance Equipment used: Rolling walker (2 wheeled) Transfers: Sit to/from Stand Sit to Stand: Min assist         General transfer comment: cues for LE management and use of UEs to self assist  Ambulation/Gait Ambulation/Gait assistance: Min assist Ambulation Distance (Feet): 22 Feet Assistive device: Rolling walker (2 wheeled) Gait Pattern/deviations: Step-to pattern;Decreased step length - right;Decreased stance time - right;Trunk flexed;Narrow base of support Gait velocity: decr   General Gait Details: cues for sequence and posture.  Min WB tolerated on R 2* pain.  75% VC's to increase step length of [name] to stretch R LE/increase R knee extension   Stairs            Wheelchair Mobility    Modified Rankin (Stroke Patients Only)       Balance                                     Cognition                            Exercises   Total Hip Replacement TE's 10 reps ankle pumps 10 reps knee presses 10 reps heel slides AAROM 10 reps SAQ's AAROM 10 reps ABD AAROM Followed by ICE     General Comments        Pertinent Vitals/Pain C/o 9/10 despite premedication ICE after    Home Living                      Prior Function            PT Goals (current goals can now be found in the care plan section)      Frequency  7X/week    PT Plan      Co-evaluation             End of Session Equipment Utilized During Treatment: Gait belt;Right knee immobilizer Activity Tolerance: Patient limited by pain;Patient tolerated treatment well Patient left: in chair;with call bell/phone within reach;with nursing/sitter in room     Time: 1111-1140 PT Time Calculation (min): 29 min  Charges:  $Gait Training: 8-22 mins $Therapeutic Exercise: 8-22 mins  G Codes:      Rica Koyanagi  PTA [facility]  Acute  Rehab Pager      [phone]

## 2013-10-26 MED ORDER — OXYCODONE HCL 5 MG PO TABS
5.0000 mg | ORAL_TABLET | ORAL | Status: DC | PRN
  Administered 2013-10-26: 10 mg via ORAL
  Administered 2013-10-26 (×2): 15 mg via ORAL
  Filled 2013-10-26 (×3): qty 3

## 2013-10-26 MED ORDER — FERROUS SULFATE 325 (65 FE) MG PO TABS: 325.0000 mg | ORAL_TABLET | Freq: Three times a day (TID) | ORAL | Status: DC

## 2013-10-26 MED ORDER — ASPIRIN 325 MG PO TBEC: 325.0000 mg | DELAYED_RELEASE_TABLET | Freq: Two times a day (BID) | ORAL | Status: DC

## 2013-10-26 MED ORDER — CYCLOBENZAPRINE HCL 10 MG PO TABS
10.0000 mg | ORAL_TABLET | Freq: Three times a day (TID) | ORAL | Status: DC | PRN
  Administered 2013-10-26 (×2): 10 mg via ORAL
  Filled 2013-10-26 (×2): qty 1

## 2013-10-26 MED ORDER — CYCLOBENZAPRINE HCL 10 MG PO TABS: 10.0000 mg | ORAL_TABLET | Freq: Three times a day (TID) | ORAL | Status: DC | PRN

## 2013-10-26 MED ORDER — OXYCODONE HCL 5 MG PO TABS: 5.0000 mg | ORAL_TABLET | ORAL | Status: DC | PRN

## 2013-10-26 NOTE — Progress Notes (Signed)
Physical Therapy Treatment Patient Details Name: Lisa [name] MRN: [id number] DOB: 1970-08-15 Today's Date: 10/26/2013    History of Present Illness [name]- anterior    PT Comments    POD #4 am session deferred 2nd pt taking a shower, too fatigued for PT after.  This afternoon performed THR TE's using a roped sheet to assist.  Pt required increased time and freq rest breaks to control pain level and minimize muscle spasms. Assisted out of recliner to amb to BR using B crutches.  Pt progressing slowly and plans to D/C to [facility] today.  Follow Up Recommendations  SNF     Equipment Recommendations       Recommendations for Other Services       Precautions / Restrictions Precautions Precautions: Fall Required Braces or Orthoses: Knee Immobilizer - Right (at times to increase knee extension) Knee Immobilizer - Right: Other (comment) (per MD note "Can occasionally rest out of her knee immobilizer") Restrictions Weight Bearing Restrictions: No Other Position/Activity Restrictions: WBAT    Mobility  Bed Mobility               General bed mobility comments: pt in chair  Transfers Overall transfer level: Needs assistance Equipment used: Rolling walker (2 wheeled) Transfers: Sit to/from Stand Sit to Stand: Min assist         General transfer comment: increased time  Ambulation/Gait Ambulation/Gait assistance: Min assist Ambulation Distance (Feet): 12 Feet Assistive device: Crutches Gait Pattern/deviations: Step-to pattern;Decreased stance time - right;Trunk flexed Gait velocity: decr   General Gait Details: cues for sequence and posture.  Min WB tolerated on R 2* pain.  75% VC's to increase step length of [name] to stretch R LE/increase R knee extension   Stairs            Wheelchair Mobility    Modified Rankin (Stroke Patients Only)       Balance                                    Cognition Arousal/Alertness:  Awake/alert Behavior During Therapy: WFL for tasks assessed/performed Overall Cognitive Status: Within Functional Limits for tasks assessed                      Exercises   Total Hip Replacement TE's 10 reps ankle pumps 10 reps knee presses 10 reps heel slides AAROM using roped sheet 10 reps SAQ's AAROM 10 reps ABD AAROM Followed by ICE     General Comments        Pertinent Vitals/Pain C/o 8/10 during TE's despite premedicated C/o MAX "tightness"    Home Living                      Prior Function            PT Goals (current goals can now be found in the care plan section) Progress towards PT goals: Progressing toward goals    Frequency  7X/week    PT Plan      Co-evaluation             End of Session Equipment Utilized During Treatment: Gait belt Activity Tolerance: Patient limited by pain;Patient tolerated treatment well Patient left: Other (comment) (in bathroom, NT notifies)     Time: 1425-1520 PT Time Calculation (min): 55 min  Charges:  $Gait Training: 8-22 mins $Therapeutic  Exercise: 23-37 mins $Therapeutic Activity: 8-22 mins                    G Codes:      Lisa ShellingLori [name]  PTA [facility]  Acute  Rehab Pager      [phone]

## 2013-10-26 NOTE — Progress Notes (Signed)
Subjective: 4 Days Post-Op Procedure(s) (LRB): RIGHT TOTAL HIP ARTHROPLASTY ANTERIOR APPROACH (Right) Patient reports pain as moderate.  Everything limited by her pain, which was quite intense prior to surgery as well.  She came in to the hospital on narcotics as well.  Objective: Vital signs in last 24 hours: Temp:  [97.9 F (36.6 C)-98.2 F (36.8 C)] 97.9 F (36.6 C) (06/30 0642) Pulse Rate:  [99-110] 99 (06/30 0642) Resp:  [18-19] 19 (06/30 0642) BP: (121-135)/(68-81) 135/81 mmHg (06/30 0642) SpO2:  [90 %-98 %] 98 % (06/30 0642)  Intake/Output from previous day: 06/29 0701 - 06/30 0700 In: 1140 [P.O.:1140] Out: -  Intake/Output this shift: Total I/O In: 660 [P.O.:660] Out: -    Recent Labs  10/24/13 0500 10/25/13 0545  HGB 8.9* 8.4*    Recent Labs  10/24/13 0500 10/25/13 0545  WBC 13.8* 12.2*  RBC 3.06* 2.96*  HCT 25.3* 24.8*  PLT 193 207   No results found for this basename: NA, K, CL, CO2, BUN, CREATININE, GLUCOSE, CALCIUM,  in the last 72 hours No results found for this basename: LABPT, INR,  in the last 72 hours  Sensation intact distally Intact pulses distally Dorsiflexion/Plantar flexion intact Incision: dressing C/D/I  Assessment/Plan: 4 Days Post-Op Procedure(s) (LRB): RIGHT TOTAL HIP ARTHROPLASTY ANTERIOR APPROACH (Right) Up with therapy Discharge to SNF when bed available.  Kathryne HitchBLACKMAN,CHRISTOPHER Y 10/26/2013, 6:56 AM

## 2013-10-26 NOTE — Progress Notes (Signed)
CSW assisting with d/c planning. Pt does not have insurance coverage for ST Rehab. [facility] is willing to offer a LOG and SNF search has been extended. At this point, we don't have any bed offers. CSW is continuing to call facilities requesting assistance. Pt has been updated.  Cori RazorJamie Haidinger LCSW [phone]

## 2013-10-26 NOTE — Discharge Summary (Signed)
Patient ID: Lisa [name] MRN: [id number] DOB/AGE: [birth year] 43 y.o.  Admit date: 10/22/2013 Discharge date: 10/26/2013  Admission Diagnoses:  Principal Problem:   Avascular necrosis of bone of right hip Active Problems:   Status post THR (total hip replacement)   Discharge Diagnoses:  Same  Past Medical History  Diagnosis Date  . Hypertension   . Asthma   . H/O: pneumothorax 2013  . Arthritis     Surgeries: Procedure(s): RIGHT TOTAL HIP ARTHROPLASTY ANTERIOR APPROACH on 10/22/2013   Consultants:    Discharged Condition: Improved  Hospital Course: Lisa [name] is an 43 y.o. female who was admitted 10/22/2013 for operative treatment ofAvascular necrosis of bone of right hip. Patient has severe unremitting pain that affects sleep, daily activities, and work/hobbies. After pre-op clearance the patient was taken to the operating room on 10/22/2013 and underwent  Procedure(s): RIGHT TOTAL HIP ARTHROPLASTY ANTERIOR APPROACH.    Patient was given perioperative antibiotics: Anti-infectives   Start     Dose/Rate Route Frequency Ordered Stop   10/22/13 1600  clindamycin (CLEOCIN) IVPB 600 mg     600 mg 100 mL/hr over 30 Minutes Intravenous Every 6 hours 10/22/13 1540 10/22/13 2214   10/22/13 0726  clindamycin (CLEOCIN) IVPB 900 mg     900 mg 100 mL/hr over 30 Minutes Intravenous On call to O.R. 10/22/13 0726 10/22/13 1010       Patient was given sequential compression devices, early ambulation, and chemoprophylaxis to prevent DVT.  Patient benefited maximally from hospital stay and there were no complications.    Recent vital signs: Patient Vitals for the past 24 hrs:  BP Temp Temp src Pulse Resp SpO2  10/26/13 0642 135/81 mmHg 97.9 F (36.6 C) Oral 99 19 98 %  10/26/13 0333 - - - - 18 -  10/25/13 2131 124/81 mmHg 98.2 F (36.8 C) Oral 102 18 98 %  10/25/13 1435 121/68 mmHg 98.1 F (36.7 C) Oral 110 18 96 %  10/25/13 0808 - - - - - 90 %     Recent laboratory  studies:  Recent Labs  10/24/13 0500 10/25/13 0545  WBC 13.8* 12.2*  HGB 8.9* 8.4*  HCT 25.3* 24.8*  PLT 193 207     Discharge Medications:     Medication List    STOP taking these medications       ALEVE 220 MG tablet  Generic drug:  naproxen sodium     HYDROcodone-acetaminophen 5-325 MG per tablet  Commonly known as:  NORCO/VICODIN     ibuprofen 200 MG tablet  Commonly known as:  ADVIL,MOTRIN     tiZANidine 4 MG tablet  Commonly known as:  ZANAFLEX      TAKE these medications       amLODipine 5 MG tablet  Commonly known as:  NORVASC  Take 5 mg by mouth every morning.     aspirin 325 MG EC tablet  Take 1 tablet (325 mg total) by mouth 2 (two) times daily after a meal.     budesonide-formoterol 160-4.5 MCG/ACT inhaler  Commonly known as:  SYMBICORT  Take 2 puffs first thing in am and then another 2 puffs about 12 hours later.     cyclobenzaprine 10 MG tablet  Commonly known as:  FLEXERIL  Take 1 tablet (10 mg total) by mouth 3 (three) times daily as needed for muscle spasms.     ferrous sulfate 325 (65 FE) MG tablet  Take 1 tablet (325 mg total) by mouth 3 (three)  times daily after meals.     losartan-hydrochlorothiazide 100-25 MG per tablet  Commonly known as:  HYZAAR  Take 1 tablet by mouth every morning.     oxyCODONE 5 MG immediate release tablet  Commonly known as:  Oxy IR/ROXICODONE  Take 1-3 tablets (5-15 mg total) by mouth every 3 (three) hours as needed for severe pain.     PROAIR HFA 108 (90 BASE) MCG/ACT inhaler  Generic drug:  albuterol  Inhale 2 puffs into the lungs every 6 (six) hours as needed for wheezing or shortness of breath.        Diagnostic Studies: Dg Hip Complete Right  10/22/2013   CLINICAL DATA:  Right total hip replacement  EXAM: DG C-ARM 1-60 MIN - NRPT [facility]; RIGHT HIP - COMPLETE 2+ VIEW  FLUOROSCOPY TIME:  7 seconds  COMPARISON:  None  FINDINGS: Right total hip arthroplasty. No failure or complication. No dislocation.   IMPRESSION: Right total hip arthroplasty.   Electronically Signed   By: Elige KoHetal  Patel   On: 10/22/2013 12:31   Dg Pelvis Portable  10/22/2013   CLINICAL DATA:  Postop hip replacement.  EXAM: PORTABLE PELVIS 1-2 VIEWS  COMPARISON:  10/22/2013  FINDINGS: Exam demonstrates evidence of patient's right total hip arthroplasty which is intact and normally located. There is minimal degenerative change over the left hip. There are skin staples over the soft tissues of the lateral right hip. Remainder the exam is unchanged.  IMPRESSION: Right total hip arthroplasty intact and normally located.   Electronically Signed   By: Elberta Fortisaniel  Boyle M.D.   On: 10/22/2013 13:53   Dg Hip Portable 1 View Right  10/22/2013   CLINICAL DATA:  Postop hip replacement.  EXAM: PORTABLE RIGHT HIP - 1 VIEW  COMPARISON:  10/22/2013  FINDINGS: Examination demonstrates evidence of patient's total right hip arthroplasty which appears adequately located and intact. Skin staples are present over the adjacent soft tissues.  IMPRESSION: Unremarkable postop appearance right total hip arthroplasty.   Electronically Signed   By: Elberta Fortisaniel  Boyle M.D.   On: 10/22/2013 13:51   Dg C-arm 1-60 Min-no Report  10/22/2013   CLINICAL DATA:  Right total hip replacement  EXAM: DG C-ARM 1-60 MIN - NRPT [facility]; RIGHT HIP - COMPLETE 2+ VIEW  FLUOROSCOPY TIME:  7 seconds  COMPARISON:  None  FINDINGS: Right total hip arthroplasty. No failure or complication. No dislocation.  IMPRESSION: Right total hip arthroplasty.   Electronically Signed   By: Elige KoHetal  Patel   On: 10/22/2013 12:31    Disposition: to skilled nursing facility      Discharge Instructions   Call MD / Call 911    Complete by:  As directed   If you experience chest pain or shortness of breath, CALL 911 and be transported to the hospital emergency room.  If you develope a fever above 101 F, pus (white drainage) or increased drainage or redness at the wound, or calf pain, call your surgeon's office.      Constipation Prevention    Complete by:  As directed   Drink plenty of fluids.  Prune juice may be helpful.  You may use a stool softener, such as Colace (over the counter) 100 mg twice a day.  Use MiraLax (over the counter) for constipation as needed.     Diet - low sodium heart healthy    Complete by:  As directed      Discharge instructions    Complete by:  As directed  Increase activities as comfort allows. Can occasionally rest out of her knee immobilizer. Can get current dressing wet in the shower. Can remove current dressing 10/29/13 and start getting her actual incision wet in the shower. New dry dressing daily starting 10/29/13. Take stool softener daily     Discharge patient    Complete by:  As directed      Increase activity slowly as tolerated    Complete by:  As directed            Follow-up Information   Follow up with Kathryne HitchBLACKMAN,CHRISTOPHER Y, MD. Schedule an appointment as soon as possible for a visit in 2 weeks.   Specialty:  Orthopedic Surgery   Contact information:   [address] [place] [address] [phone]        Signed: Kathryne HitchBLACKMAN,CHRISTOPHER Y 10/26/2013, 7:03 AM

## 2013-10-26 NOTE — Progress Notes (Signed)
Clinical Social Work Department CLINICAL SOCIAL WORK PLACEMENT NOTE 10/26/2013  Patient:  Cherylin MylarFISHER,[place]wella R  Account Number:  [id number] Admit date:  10/22/2013  Clinical Social Worker:  Doroteo GlassmanAMANDA SIMPSON, LCSWA  Date/time:  10/24/2013 02:42 PM  Clinical Social Work is seeking post-discharge placement for this patient at the following level of care:   SKILLED NURSING   (*CSW will update this form in Epic as items are completed)   10/24/2013  Patient/family provided with [facility] Department of Clinical Social Work's list of facilities offering this level of care within the geographic area requested by the patient (or if unable, by the patient's family).  10/24/2013  Patient/family informed of their freedom to choose among providers that offer the needed level of care, that participate in Medicare, Medicaid or managed care program needed by the patient, have an available bed and are willing to accept the patient.  10/24/2013  Patient/family informed of [facility]' ownership interest in [facility], as well as of the fact that they are under no obligation to receive care at this facility.  PASARR submitted to EDS on 10/24/2013 PASARR number received on 10/24/2013  FL2 transmitted to all facilities in geographic area requested by pt/family on  10/24/2013 FL2 transmitted to all facilities within larger geographic area on   Patient informed that his/her managed care company has contracts with or will negotiate with  certain facilities, including the following:     Patient/family informed of bed offers received:  10/26/2013 [facility] Physician recommends and patient chooses bed at    Patient to [facility] transferred [facility] on  10/26/2013 Patient to be transferred to facility by  Patient and family notified of transfer on 10/26/2013 Name of family member notified:  declined CSW assistance  The following physician request were  entered in Epic:   Additional Comments: NSG reviewed d/c summary, avs, scripts. Scripts were included in d/c packet.  Cori RazorJamie Haidinger LCSW [phone]

## 2013-10-26 NOTE — Progress Notes (Signed)
Pt has accepted [facility] bed [facility]. No other bed offers at this time. SN[facility]. Pt is ready for d/c and [facility] has been contacted for transport. Pt will notify her family.  Pt is in agreement with d/c today to SNF.  Cori RazorJamie Haidinger LCSW [phone]

## 2013-10-26 NOTE — Progress Notes (Signed)
Occupational Therapy Treatment Patient Details Name: Lisa [name] MRN: [id number] DOB: 03/01/1971 Today's Date: 10/26/2013    History of present illness [name]- anterior   OT comments  Pt practiced with AE for LB self care and she did well. Pt pleased with being able to do more of her LB dressing herself using AE. Will benefit from continued OT to progress ADL independence. She is highly motivated.   Follow Up Recommendations  SNF;Supervision/Assistance - 24 hour    Equipment Recommendations  None recommended by OT    Recommendations for Other Services      Precautions / Restrictions Precautions Precautions: Fall Required Braces or Orthoses: Knee Immobilizer - Right Knee Immobilizer - Right: Other (comment) (per MD note "Can occasionally rest out of her knee immobilizer") Restrictions Weight Bearing Restrictions: No Other Position/Activity Restrictions: WBAT       Mobility Bed Mobility                  Transfers                 General transfer comment: deferred this visit due to fatigue. just showered with nursing tech    Balance                                   ADL                       Lower Body Dressing: Supervision/safety;Sitting/lateral leans Lower Body Dressing Details (indicate cue type and reason): see notes below; increased time               General ADL Comments: pt states she just finished a shower with the nursing tech and is very fatigued but is agreeable to sit on the edge of the chair and practice further with AE. Note pt with AE kit issued but no sponge available at time of eval per pt and has extra long reacher in kit. Exchanged out for regular length reacher which is more appropriate for pt to be able to manuever with clothing. Pt practiced with reacher to doff R sock and was able to with supervision and increased time as well as verbal and demo cues. She donned R sock X 2 with sock aid with increased  time and supervision. She also practiced donning sweatpants over LEs and pulled up to her knees using the reacher but declined standing to practice pulling up this session due to fatigue. Pt was very pleased with being able to don clothes over R foot today though painful to lift R LE off the floor to manipulate AE. Per MD note ok for pt to rest out of immobilizer occasionally. Pt states she wore KI during the AM and had it off to shower. She states she would like to leave it off for a little bit to ice it down. Issued LHS also.       Vision                     Perception     Praxis      Cognition   Behavior During Therapy: [facility] for tasks assessed/performed Overall Cognitive Status: Within Functional Limits for tasks assessed                       Extremity/Trunk Assessment  Exercises     Shoulder Instructions       General Comments      Pertinent Vitals/ Pain       3/10 R hip; reposition, ice  Home Living                                          Prior Functioning/Environment              Frequency Min 2X/week     Progress Toward Goals  OT Goals(current goals can now be found in the care plan section)  Progress towards OT goals: Progressing toward goals     Plan Discharge plan remains appropriate    Co-evaluation                 End of Session     Activity Tolerance Patient limited by fatigue   Patient Left in chair;with call bell/phone within reach   Nurse Communication          Time: 2415-9017 OT Time Calculation (min): 43 min  Charges: OT General Charges $OT Visit: 1 Procedure OT Treatments $Self Care/Home Management : 23-37 mins $Therapeutic Activity: 8-22 mins  Jules Schick [phone] 10/26/2013, 12:52 PM

## 2014-01-11 ENCOUNTER — Other Ambulatory Visit (HOSPITAL_COMMUNITY): Payer: Self-pay | Admitting: Orthopaedic Surgery

## 2014-01-12 ENCOUNTER — Encounter (HOSPITAL_COMMUNITY): Payer: Self-pay | Admitting: *Deleted

## 2014-01-12 NOTE — Progress Notes (Addendum)
1610 Received information from [facility] placed in chart copy put to pharmacy tech's desk to complete pre op med review.

## 2014-01-12 NOTE — Progress Notes (Signed)
Called [facility] again no answer for Lisa Shuck RN case manager unable to get thru to patient station where patient is a resident.

## 2014-01-12 NOTE — Progress Notes (Addendum)
                                 Your procedure is scheduled on:   Report to [facility] at AM.  Call this number if you have problems the morning of surgery: [phone]   Remember: Bring insurance card and picture ID, PLEASE SEND CURRENT                  MEDICATION LIST AND TIME LAST MEDICATIONS TAKEN  WITH                             PATIENT MORNING OF SURGERY.  Do not drink liquids or  eat food:After Midnight.    Take these medicines the morning of surgery with A SIP OF WATER:                               SEE [place] PREPARING FOR SURGERY SHEET    Do not wear jewelry, make-up or nail polish.  Do not wear lotions, powders, or perfumes. You may wear deodorant.             Men may shave face and neck.  Do not bring valuables to the hospital.  Contacts, dentures or bridgework may not be worn into surgery.  Leave suitcase in the car. After surgery it may be brought to your room.  For patients admitted to the hospital, checkout time is 11:00 AM the day of                         discharge.   Patients discharged the day of surgery will not be allowed to drive home.  Name and phone number of your driver:      Please read over the following fact sheets that you were given:MRSA Information                    Call Jolyn Nap, RN pre op nurse if needed [phone]               FAILURE TO FOLLOW THESE INSTRUCTIONS MAY RESULT IN THE            CANCELLATION OF YOUR SURGERY.    PATIENT   SIGNATURE___________________________________________________

## 2014-01-12 NOTE — Progress Notes (Addendum)
Your procedure is scheduled on:   Report to [facility] 0600 at AM.  Call this number if you have problems the morning of surgery: [phone]                            Spoke with Morrie Sheldon nurse caring for Lisa [name] and reviewed the pre op instructions by telephone will fax the pre op instructions to [facility].  Morrie Sheldon will review and confirm that she received the instructions.   Remember: Bring insurance card and picture ID, PLEASE SEND CURRENT MEDICATION LIST AND TIME             LAST MEDICATIONS TAKEN  WITH PATIENT MORNING OF SURGERY.  Do not drink liquids or  eat food:After Midnight.    Take these medicines the morning of surgery with A SIP OF WATER:   Norvasc, may take Oxycodone if needed with a sip of water  Use Symbicort inhaler               SEE [place] PREPARING FOR SURGERY SHEET                                                                                                                         Do not wear jewelry, make-up or nail polish.  Do not wear lotions, powders, or perfumes. You may wear deodorant.             Men may shave face and neck.  Do not bring valuables to the hospital.  Contacts, dentures or bridgework may not be worn into surgery.  Leave suitcase in the car. After surgery it may be brought to your room.  For patients admitted to the hospital, checkout time is 11:00 AM the day of discharge.   Patients discharged the day of surgery will not be allowed to drive home.  Name and phone number of your driver:  Transportation will be provided by [facility] staff                                                                                 [phone]     Please read over the following fact sheets that you were given:MRSA Information                    Call Jolyn Nap, RN pre op nurse if needed [phone]               FAILURE TO FOLLOW THESE INSTRUCTIONS  MAY RESULT IN THE             CANCELLATION OF YOUR SURGERY.    PATIENT   SIGNATURE___________________________________________________                                     Your procedure is scheduled on:   Report to [facility] at AM.  Call this number if you have problems the morning of surgery: [phone]   Remember: Bring insurance card and picture ID, PLEASE SEND CURRENT                  MEDICATION LIST AND TIME LAST MEDICATIONS TAKEN  WITH                             PATIENT MORNING OF SURGERY.  Do not drink liquids or  eat food:After Midnight.    Take these medicines the morning of surgery with A SIP OF WATER:                               SEE [place] PREPARING FOR SURGERY SHEET    Do not wear jewelry, make-up or nail polish.  Do not wear lotions, powders, or perfumes. You may wear deodorant.             Men may shave face and neck.  Do not bring valuables to the hospital.  Contacts, dentures or bridgework may not be worn into surgery.  Leave suitcase in the car. After surgery it may be brought to your room.  For patients admitted to the hospital, checkout time is 11:00 AM the day of                         discharge.   Patients discharged the day of surgery will not be allowed to drive home.  Name and phone number of your driver:      Please read over the following fact sheets that you were given:MRSA Information                    Call Jolyn Nap, RN pre op nurse if needed [phone]               FAILURE TO FOLLOW THESE INSTRUCTIONS MAY RESULT IN THE            CANCELLATION OF YOUR SURGERY.    PATIENT   SIGNATURE___________________________________________________                                     Your procedure is scheduled on:   Report to [facility] at AM.  Call this number if you have problems the morning of surgery: [phone]   Remember: Bring insurance card and picture ID, PLEASE SEND CURRENT  MEDICATION LIST AND TIME LAST MEDICATIONS TAKEN  WITH  PATIENT MORNING OF SURGERY.  Do not drink liquids or  eat food:After Midnight.    Take these medicines the morning of surgery with A SIP OF WATER:  SEE [place] PREPARING FOR SURGERY SHEET    Do not wear jewelry, make-up or nail polish.  Do not wear lotions, powders, or perfumes. You may wear deodorant.             Men may shave face and neck.  Do not bring valuables to the hospital.  Contacts, dentures or bridgework may not be worn into surgery.  Leave suitcase in the car. After surgery it may be brought to your room.  For patients admitted to the hospital, checkout time is 11:00 AM the day of                         discharge.   Patients discharged the day of surgery will not be allowed to drive home.  Name and phone number of your driver:      Please read over the following fact sheets that you were given:MRSA Information                    Call Jolyn Nap, RN pre op nurse if needed [phone]               FAILURE TO FOLLOW THESE INSTRUCTIONS MAY RESULT IN THE            CANCELLATION OF YOUR SURGERY.    PATIENT   SIGNATURE___________________________________________________

## 2014-01-12 NOTE — Progress Notes (Addendum)
                 Your procedure is scheduled on:   Report to [facility] at 0600 AM.  Call this number if you have problems the morning of surgery: [phone]   Remember: Bring insurance card and picture ID, PLEASE SEND CURRENT  MEDICATION LIST AND TIME LAST MEDICATIONS TAKEN  WITH PATIENT MORNING OF SURGERY.  Do not drink liquids or  eat food:After Midnight. Wednesday, January 12, 2014   Take these medicines the morning of surgery with A SIP OF WATER:  Norvasc, may take Oxycodone if needed and use Symbicort inhaler                                          SEE [place] PREPARING FOR SURGERY SHEET  Do not wear jewelry, make-up or nail polish.  Do not wear lotions, powders, or perfumes. You may wear deodorant.             Men may shave face and neck.  Do not bring valuables to the hospital.  Contacts, dentures or bridgework may not be worn into surgery.  Leave suitcase in the car. After surgery it may be brought to your room.  For patients admitted to the hospital, checkout time is 11:00 AM the day of  discharge.   Patients discharged the day of surgery will not be allowed to drive home.  Name and phone number of your driver: Transportation provided by [facility] [phone]     Please read over the following fact sheets that you were given:MRSA Information                    Call Jolyn Nap, RN pre op nurse if needed [phone]               FAILURE TO FOLLOW THESE INSTRUCTIONS MAY RESULT IN THE            CANCELLATION OF YOUR SURGERY.    PATIENT   SIGNATURE___________________________________________________

## 2014-01-12 NOTE — Progress Notes (Addendum)
Late Entry 1000 Called [facility] to speak with nurse to have a copy of recent medication list send, a copy of insurance information and a recent history and physical.. I spoke with nurse Morrie Sheldon gave our fax number and she was to fax the information and call me back in 20 minutes to review the patient history after completion of the AM  medication pass. Late Entry 1045 Called again to [facility] spoke with nurse Morrie Sheldon to do health history, give pre op instructions, review medications, get fax number and get contact family information.  Lately I let Morrie Sheldon know that I was going to fax her the instructions. Late Entry 1230  Called [facility] to speak with Dirrctor of Nursing and got answering machine for case manager name Lovenia Shuck left a message on her answering machine.

## 2014-01-13 ENCOUNTER — Encounter (HOSPITAL_COMMUNITY)
Admission: RE | Disposition: A | Discharge status: Other Acute Inpatient Hospital | Payer: Self-pay | Source: Ambulatory Visit | Attending: Orthopaedic Surgery

## 2014-01-13 ENCOUNTER — Ambulatory Visit (HOSPITAL_COMMUNITY): Payer: Medicaid Other

## 2014-01-13 ENCOUNTER — Encounter (HOSPITAL_COMMUNITY): Payer: Self-pay | Admitting: Pharmacy Technician

## 2014-01-13 ENCOUNTER — Encounter (HOSPITAL_COMMUNITY): Payer: Self-pay | Admitting: *Deleted

## 2014-01-13 ENCOUNTER — Encounter (HOSPITAL_COMMUNITY): Payer: Medicaid Other | Admitting: Anesthesiology

## 2014-01-13 ENCOUNTER — Ambulatory Visit (HOSPITAL_COMMUNITY)
Admission: RE | Admit: 2014-01-13 | Discharge: 2014-01-13 | Payer: Medicaid Other | Source: Ambulatory Visit | Attending: Orthopaedic Surgery | Admitting: Orthopaedic Surgery

## 2014-01-13 ENCOUNTER — Ambulatory Visit (HOSPITAL_COMMUNITY): Payer: Medicaid Other | Admitting: Anesthesiology

## 2014-01-13 DIAGNOSIS — M8708 Idiopathic aseptic necrosis of bone, other site: Secondary | ICD-10-CM | POA: Diagnosis not present

## 2014-01-13 DIAGNOSIS — Z79899 Other long term (current) drug therapy: Secondary | ICD-10-CM | POA: Diagnosis not present

## 2014-01-13 DIAGNOSIS — G894 Chronic pain syndrome: Secondary | ICD-10-CM | POA: Diagnosis not present

## 2014-01-13 DIAGNOSIS — M24661 Ankylosis, right knee: Secondary | ICD-10-CM

## 2014-01-13 DIAGNOSIS — M24669 Ankylosis, unspecified knee: Secondary | ICD-10-CM | POA: Diagnosis not present

## 2014-01-13 DIAGNOSIS — J45909 Unspecified asthma, uncomplicated: Secondary | ICD-10-CM | POA: Diagnosis not present

## 2014-01-13 DIAGNOSIS — I10 Essential (primary) hypertension: Secondary | ICD-10-CM | POA: Diagnosis not present

## 2014-01-13 DIAGNOSIS — M24659 Ankylosis, unspecified hip: Secondary | ICD-10-CM

## 2014-01-13 DIAGNOSIS — M24651 Ankylosis, right hip: Secondary | ICD-10-CM

## 2014-01-13 DIAGNOSIS — M2460 Ankylosis, unspecified joint: Principal | ICD-10-CM

## 2014-01-13 DIAGNOSIS — Z880 Allergy status to penicillin: Secondary | ICD-10-CM | POA: Insufficient documentation

## 2014-01-13 HISTORY — PX: KNEE CLOSED REDUCTION: SHX995

## 2014-01-13 HISTORY — PX: HIP CLOSED REDUCTION: SHX983

## 2014-01-13 LAB — CBC
HEMATOCRIT: 34.5 % — AB (ref 36.0–46.0)
HEMOGLOBIN: 11.4 g/dL — AB (ref 12.0–15.0)
MCH: 27.3 pg (ref 26.0–34.0)
MCHC: 33 g/dL (ref 30.0–36.0)
MCV: 82.5 fL (ref 78.0–100.0)
Platelets: 276 10*3/uL (ref 150–400)
RBC: 4.18 MIL/uL (ref 3.87–5.11)
RDW: 14.1 % (ref 11.5–15.5)
WBC: 7.8 10*3/uL (ref 4.0–10.5)

## 2014-01-13 LAB — BASIC METABOLIC PANEL
ANION GAP: 10 (ref 5–15)
BUN: 12 mg/dL (ref 6–23)
CO2: 28 meq/L (ref 19–32)
Calcium: 9.5 mg/dL (ref 8.4–10.5)
Chloride: 101 mEq/L (ref 96–112)
Creatinine, Ser: 0.6 mg/dL (ref 0.50–1.10)
GFR calc Af Amer: >90 mL/min (ref >90)
GLUCOSE: 99 mg/dL (ref 70–99)
POTASSIUM: 3.1 meq/L — AB (ref 3.7–5.3)
SODIUM: 139 meq/L (ref 137–147)

## 2014-01-13 SURGERY — CLOSED MANIPULATION, JOINT, HIP
Anesthesia: General | Laterality: Right

## 2014-01-13 MED ORDER — DEXAMETHASONE SODIUM PHOSPHATE 10 MG/ML IJ SOLN
INTRAMUSCULAR | Status: DC | PRN
  Administered 2014-01-13: 10 mg via INTRAVENOUS

## 2014-01-13 MED ORDER — FENTANYL CITRATE 0.05 MG/ML IJ SOLN
25.0000 ug | INTRAMUSCULAR | Status: DC | PRN
  Administered 2014-01-13 (×4): 25 ug via INTRAVENOUS

## 2014-01-13 MED ORDER — ONDANSETRON HCL 4 MG/2ML IJ SOLN
INTRAMUSCULAR | Status: AC
  Filled 2014-01-13: qty 2

## 2014-01-13 MED ORDER — PROMETHAZINE HCL 25 MG/ML IJ SOLN: 6.2500 mg | INTRAMUSCULAR | Status: DC | PRN

## 2014-01-13 MED ORDER — METHOCARBAMOL 1000 MG/10ML IJ SOLN
500.0000 mg | Freq: Once | INTRAVENOUS | Status: AC
  Administered 2014-01-13: 500 mg via INTRAVENOUS
  Filled 2014-01-13: qty 5

## 2014-01-13 MED ORDER — LACTATED RINGERS IV SOLN
INTRAVENOUS | Status: DC | PRN
  Administered 2014-01-13: 08:00:00 via INTRAVENOUS

## 2014-01-13 MED ORDER — METHYLPREDNISOLONE ACETATE 40 MG/ML IJ SUSP
INTRAMUSCULAR | Status: AC
  Filled 2014-01-13: qty 3

## 2014-01-13 MED ORDER — FENTANYL CITRATE 0.05 MG/ML IJ SOLN
INTRAMUSCULAR | Status: DC
Start: 2014-01-13 — End: 2014-01-13
  Filled 2014-01-13: qty 2

## 2014-01-13 MED ORDER — FENTANYL CITRATE 0.05 MG/ML IJ SOLN
INTRAMUSCULAR | Status: AC
  Filled 2014-01-13: qty 5

## 2014-01-13 MED ORDER — MIDAZOLAM HCL 2 MG/2ML IJ SOLN
INTRAMUSCULAR | Status: AC
  Filled 2014-01-13: qty 2

## 2014-01-13 MED ORDER — MIDAZOLAM HCL 5 MG/5ML IJ SOLN
INTRAMUSCULAR | Status: DC | PRN
  Administered 2014-01-13 (×2): 0.5 mg via INTRAVENOUS

## 2014-01-13 MED ORDER — METOCLOPRAMIDE HCL 5 MG/ML IJ SOLN
INTRAMUSCULAR | Status: DC | PRN
  Administered 2014-01-13: 5 mg via INTRAVENOUS

## 2014-01-13 MED ORDER — PROPOFOL 10 MG/ML IV BOLUS
INTRAVENOUS | Status: AC
  Filled 2014-01-13: qty 20

## 2014-01-13 MED ORDER — FENTANYL CITRATE 0.05 MG/ML IJ SOLN: 25.0000 ug | INTRAMUSCULAR | Status: DC | PRN

## 2014-01-13 MED ORDER — PROPOFOL 10 MG/ML IV BOLUS
INTRAVENOUS | Status: DC | PRN
  Administered 2014-01-13: 175 mg via INTRAVENOUS
  Administered 2014-01-13: 25 mg via INTRAVENOUS

## 2014-01-13 MED ORDER — LACTATED RINGERS IV SOLN
INTRAVENOUS | Status: DC
  Administered 2014-01-13 (×2): 1000 mL via INTRAVENOUS

## 2014-01-13 MED ORDER — BUPIVACAINE HCL 0.5 % IJ SOLN
INTRAMUSCULAR | Status: DC | PRN
  Administered 2014-01-13 (×2): 3 mL

## 2014-01-13 MED ORDER — LIDOCAINE HCL (CARDIAC) 20 MG/ML IV SOLN
INTRAVENOUS | Status: DC | PRN
  Administered 2014-01-13: 75 mg via INTRAVENOUS

## 2014-01-13 MED ORDER — HYDROMORPHONE HCL 1 MG/ML IJ SOLN
0.2500 mg | INTRAMUSCULAR | Status: DC | PRN
  Administered 2014-01-13: 0.5 mg via INTRAVENOUS

## 2014-01-13 MED ORDER — LIDOCAINE HCL (CARDIAC) 20 MG/ML IV SOLN
INTRAVENOUS | Status: AC
  Filled 2014-01-13: qty 5

## 2014-01-13 MED ORDER — BUPIVACAINE HCL (PF) 0.5 % IJ SOLN
INTRAMUSCULAR | Status: AC
  Filled 2014-01-13: qty 30

## 2014-01-13 MED ORDER — ACETAMINOPHEN 10 MG/ML IV SOLN
1000.0000 mg | Freq: Once | INTRAVENOUS | Status: DC
  Filled 2014-01-13: qty 100

## 2014-01-13 MED ORDER — MEPERIDINE HCL 50 MG/ML IJ SOLN: 6.2500 mg | INTRAMUSCULAR | Status: DC | PRN

## 2014-01-13 MED ORDER — FENTANYL CITRATE 0.05 MG/ML IJ SOLN
INTRAMUSCULAR | Status: DC | PRN
  Administered 2014-01-13: 100 ug via INTRAVENOUS
  Administered 2014-01-13 (×3): 50 ug via INTRAVENOUS

## 2014-01-13 MED ORDER — ACETAMINOPHEN 10 MG/ML IV SOLN
INTRAVENOUS | Status: DC | PRN
  Administered 2014-01-13: 1000 mg via INTRAVENOUS

## 2014-01-13 MED ORDER — ONDANSETRON HCL 4 MG/2ML IJ SOLN
INTRAMUSCULAR | Status: DC | PRN
  Administered 2014-01-13: 4 mg via INTRAVENOUS

## 2014-01-13 MED ORDER — CLINDAMYCIN PHOSPHATE 900 MG/50ML IV SOLN: 900.0000 mg | INTRAVENOUS | Status: DC

## 2014-01-13 MED ORDER — METHYLPREDNISOLONE ACETATE 40 MG/ML IJ SUSP
INTRAMUSCULAR | Status: DC | PRN
  Administered 2014-01-13: 40 mg

## 2014-01-13 MED ORDER — DEXAMETHASONE SODIUM PHOSPHATE 10 MG/ML IJ SOLN
INTRAMUSCULAR | Status: AC
  Filled 2014-01-13: qty 1

## 2014-01-13 MED ORDER — HYDROMORPHONE HCL 1 MG/ML IJ SOLN
INTRAMUSCULAR | Status: AC
  Filled 2014-01-13: qty 1

## 2014-01-13 MED ORDER — KETOROLAC TROMETHAMINE 30 MG/ML IJ SOLN
INTRAMUSCULAR | Status: DC | PRN
  Administered 2014-01-13: 30 mg via INTRAVENOUS

## 2014-01-13 SURGICAL SUPPLY — 12 items
BANDAGE ADH SHEER 1  50/CT (GAUZE/BANDAGES/DRESSINGS) ×6 IMPLANT
GAUZE SPONGE 4X4 12PLY STRL (GAUZE/BANDAGES/DRESSINGS) IMPLANT
GLOVE BIOGEL PI IND STRL 8 (GLOVE) IMPLANT
GLOVE BIOGEL PI INDICATOR 8 (GLOVE)
GLOVE ECLIPSE 8.0 STRL XLNG CF (GLOVE) IMPLANT
GOWN STRL REUS W/TWL XL LVL3 (GOWN DISPOSABLE) IMPLANT
IMMOBILIZER KNEE 20 (SOFTGOODS) ×3 IMPLANT
NDL SAFETY ECLIPSE 18X1.5 (NEEDLE) ×2 IMPLANT
NEEDLE HYPO 18GX1.5 SHARP (NEEDLE) ×4
POSITIONER SURGICAL ARM (MISCELLANEOUS) IMPLANT
SYR CONTROL 10ML LL (SYRINGE) IMPLANT
TOWEL OR 17X26 10 PK STRL BLUE (TOWEL DISPOSABLE) IMPLANT

## 2014-01-13 NOTE — Anesthesia Postprocedure Evaluation (Signed)
  Anesthesia Post-op Note  Patient: Lisa [name]  Procedure(s) Performed: Procedure(s) (LRB): CLOSED MANIPULATION RIGHT HIP and RIGHT KNEE (Right) CLOSED MANIPULATION KNEE (Right)  Patient Location: PACU  Anesthesia Type: General  Level of Consciousness: awake and alert   Airway and Oxygen Therapy: Patient Spontanous Breathing  Post-op Pain: mild  Post-op Assessment: Post-op Vital signs reviewed, Patient's Cardiovascular Status Stable, Respiratory Function Stable, Patent Airway and No signs of Nausea or vomiting  Last Vitals:  Filed Vitals:   01/13/14 0909  BP: 126/75  Pulse: 100  Temp: 36.9 C  Resp: 19    Post-op Vital Signs: stable   Complications: No apparent anesthesia complications

## 2014-01-13 NOTE — Discharge Instructions (Signed)
Try to increase activities as much as possible. Wok on range-of-motion right hip and right knee. Try to use the CPM twice daily on right knee from 0 degrees to 90

## 2014-01-13 NOTE — Progress Notes (Signed)
PACU note----pt still c/o pain especially muscle spasms, Dr. Gentry Roch, anesthes, called, orders rec'd and meds ordered to give

## 2014-01-13 NOTE — Progress Notes (Signed)
PACU note-----pt a little more comfortable; pt repositioned herself to left side, pillow between legs

## 2014-01-13 NOTE — Op Note (Signed)
NAME:  Lisa [name], Lisa [name] NO.:  [id number]  MEDICAL RECORD NO.:  [id number]  LOCATION:  [facility]                         FACILITY:  [facility]  PHYSICIAN:  Vanita Panda. Magnus Ivan, M.D.DATE OF BIRTH:  1970-10-23  DATE OF PROCEDURE:  01/13/2014 DATE OF DISCHARGE:                              OPERATIVE REPORT   PREOPERATIVE DIAGNOSIS:  Arthrofibrosis, right knee and right hip.  POSTOPERATIVE DIAGNOSIS:  Arthrofibrosis, right knee and right hip.  PROCEDURE:  Manipulation under anesthesia, right knee and right hip.  SURGEON:  Vanita Panda. Magnus Ivan, MD  ANESTHESIA: 1. General. 2. Right hip and right knee injection each with mixture of 4 mL of     0.5% plain Marcaine mixed with 1 mL of Depo-Medrol.  BLOOD LOSS:  Not applicable.  COMPLICATIONS:  None.  INDICATIONS:  Lisa [name] is a 43 year old with an unfortunate musculoskeletal history.  Several years ago, she presented to our office with severe right hip pain and significant avascular necrosis.  We set her up for surgery for a total hip arthroplasty.  However, she had some pulmonary issues and we were unable to perform the surgery and she saw a pulmonologist for some time.  Unfortunately, started developing worsening right hip pain and the need for significant increase in narcotic pain medications.  Over the last 2 years, this has worsened to the point where she developed a flexion contracture of right hip and her right knee to try to help subside her pain.  This certainly made for more difficult surgery, once she finally got cleared for surgery.  We counseled about narcotic use extensively, but she continued to take these.  She was taken to the operating room, this year in June for a right total hip arthroplasty which was quite difficult, but we were able to do this through direct anterior approach.  Even at the time of surgery, we had to manipulate her right hip and her right knee.  She has been recovering in  skilled nursing facility, but is developing again flexion contractures of her right hip and her right knee due to severity of her pain and her pain syndrome.  She is still on high-dose narcotic medications.  She has been going to physical therapy and there are hopes that we can manipulate her hip and knee again to see if this will improve her mobility.  She understands fully that with the high dose narcotics that she is on and the pain syndrome that she says that this may not work unless she pushes through the pain and tries to get moving with therapy as much as possible.  PROCEDURE DESCRIPTION:  After informed consent was obtained, the right hip and right knee was marked.  She was brought to the operating room and general anesthesia was obtained while she was on her stretcher.  She was next placed supine on the operating table.  A time-out was called and she was identified as correct patient, correct right hip and right knee.  The knee we found with at least a 10-15 degree flexion contracture with good flexor all the way, but the extension was lacking. We were able to manipulate her knee and get close to full extension. Her hip  is certainly short on her right side, but we were able to at least get it through internal and external rotation and flexion extension with little difficulty.  We felt minimal scar tissue freed up with both the right hip and right knee manipulation.  We then placed a steroid injection in her right knee and her right hip with a mixture of Marcaine and Depo-Medrol and both areas after cleaning with Betadine and alcohol and placed a Band-Aid over each of these.  We placed her knee in a right knee immobilizer before she was awakened.  She was then awakened, extubated, and taken to recovery room in stable condition. Postoperatively, she will return to the nursing facility.  We have her set up for CPM for right knee and she has JAS splint for her right knee as well and  the facility will work aggressively with therapy to try to get her moving and we will see her back in the office in 2-4 weeks.     Vanita Panda. Magnus Ivan, M.D.     CYB/MEDQ  D:  01/13/2014  T:  01/13/2014  Job:  [id number]

## 2014-01-13 NOTE — Brief Op Note (Signed)
01/13/2014  8:58 AM  PATIENT:  Lisa [name]  43 y.o. female  PRE-OPERATIVE DIAGNOSIS:  Arthrofibrosis right hip and right knee  POST-OPERATIVE DIAGNOSIS:  Same  PROCEDURE:  Procedure(s): CLOSED MANIPULATION RIGHT HIP and RIGHT KNEE (Right) CLOSED MANIPULATION KNEE (Right)  SURGEON:  Surgeon(s) and Role:    * Kathryne Hitch, MD - Primary  ASSISTANTS: none   ANESTHESIA:   local and general  EBL:    n/a  DICTATION: .Other Dictation: Dictation Number [id number]  PLAN OF CARE: Discharge to home after PACU  PATIENT DISPOSITION:  PACU - hemodynamically stable.   Delay start of Pharmacological VTE agent (>24hrs) due to surgical blood loss or risk of bleeding: not applicable

## 2014-01-13 NOTE — Anesthesia Preprocedure Evaluation (Addendum)
Anesthesia Evaluation  Patient identified by MRN, date of birth, ID band Patient awake    Reviewed: Allergy & Precautions, H&P , NPO status , Patient's Chart, lab work & pertinent test results  History of Anesthesia Complications Negative for: history of anesthetic complications  Airway Mallampati: III TM Distance: >3 FB Neck ROM: Full    Dental no notable dental hx.    Pulmonary asthma , Current Smoker,  breath sounds clear to auscultation  Pulmonary exam normal       Cardiovascular hypertension, Pt. on medications Rhythm:Regular Rate:Normal     Neuro/Psych negative neurological ROS  negative psych ROS   GI/Hepatic negative GI ROS, Neg liver ROS,   Endo/Other  negative endocrine ROS  Renal/GU negative Renal ROS  negative genitourinary   Musculoskeletal  (+) Arthritis -, Osteoarthritis,    Abdominal   Peds negative pediatric ROS (+)  Hematology negative hematology ROS (+)   Anesthesia Other Findings   Reproductive/Obstetrics negative OB ROS                          Anesthesia Physical Anesthesia Plan  ASA: II  Anesthesia Plan: General   Post-op Pain Management:    Induction: Intravenous  Airway Management Planned: LMA  Additional Equipment:   Intra-op Plan:   Post-operative Plan: Extubation in OR  Informed Consent: I have reviewed the patients History and Physical, chart, labs and discussed the procedure including the risks, benefits and alternatives for the proposed anesthesia with the patient or authorized representative who has indicated his/her understanding and acceptance.   Dental advisory given  Plan Discussed with: CRNA  Anesthesia Plan Comments:        Anesthesia Quick Evaluation

## 2014-01-13 NOTE — Anesthesia Procedure Notes (Signed)
Procedure Name: LMA Insertion Date/Time: 01/13/2014 8:41 AM Performed by: Edison Pace Pre-anesthesia Checklist: Patient identified, Emergency Drugs available, Suction available, Patient being monitored and Timeout performed Patient Re-evaluated:Patient Re-evaluated prior to inductionOxygen Delivery Method: Circle system utilized Preoxygenation: Pre-oxygenation with 100% oxygen Intubation Type: IV induction LMA: LMA inserted LMA Size: 4.0 Number of attempts: 1 Placement Confirmation: positive ETCO2 Tube secured with: Tape Dental Injury: Teeth and Oropharynx as per pre-operative assessment

## 2014-01-13 NOTE — H&P (Signed)
Lisa [name] is an 43 y.o. female.   Chief Complaint:   Right hip and knee stiffness with decreased motion and pain HPI:   43 yo female with an unfortunate past medical history of right hip AVN.  2 years ago she developed severe right hip pain.  A total hip was recommended at that time, but due to pulmonary issues, was never cleared for surgery.  She went on to taking lots of narcotic pain meds and eventually developed right hip and knee contractures due to her pain.  She was eventually cleared for surgery this year, and in June underwent a right total hip replacement.  This was difficult due to her contractures and we manipulated her right hip and knee at the time of her hip replacement.  She continues now to have chronic severe pain and is on high dose narcotics.  She has developed contractures once again of her hip and knee on the right although she has had intensive therapy.  She now presents for an attempt of another manipulation of her right hip and knee under general anesthesia.  She understands fully the risks and benefits involved.  Past Medical History  Diagnosis Date  . Hypertension   . Asthma   . H/O: pneumothorax 2013  . Arthritis     Past Surgical History  Procedure Laterality Date  . Mouth surgery    . Ankle surgery    . Cesarean section    . Total hip arthroplasty Right 10/22/2013    Procedure: RIGHT TOTAL HIP ARTHROPLASTY ANTERIOR APPROACH;  Surgeon: Mcarthur Rossetti, MD;  Location: [facility];  Service: Orthopedics;  Laterality: Right;    History reviewed. No pertinent family history. Social History:  reports that she has been smoking.  She does not have any smokeless tobacco history on file. She reports that she drinks alcohol. She reports that she does not use illicit drugs.  Allergies:  Allergies  Allergen Reactions  . Penicillins Swelling and Rash    Medications Prior to Admission  Medication Sig Dispense Refill  . albuterol (PROAIR HFA) 108 (90 BASE)  MCG/ACT inhaler Inhale 2 puffs into the lungs every 6 (six) hours as needed for wheezing or shortness of breath.      Marland Kitchen amLODipine (NORVASC) 5 MG tablet Take 5 mg by mouth every morning.       Marland Kitchen aspirin EC 325 MG EC tablet Take 1 tablet (325 mg total) by mouth 2 (two) times daily after a meal.  30 tablet  0  . budesonide-formoterol (SYMBICORT) 160-4.5 MCG/ACT inhaler Take 2 puffs first thing in am and then another 2 puffs about 12 hours later.  1 Inhaler  12  . cyclobenzaprine (FLEXERIL) 10 MG tablet Take 1 tablet (10 mg total) by mouth 3 (three) times daily as needed for muscle spasms.  60 tablet  0  . docusate sodium (COLACE) 100 MG capsule Take 100 mg by mouth daily.      . ferrous sulfate 325 (65 FE) MG tablet Take 1 tablet (325 mg total) by mouth 3 (three) times daily after meals.  90 tablet  0  . losartan-hydrochlorothiazide (HYZAAR) 100-25 MG per tablet Take 1 tablet by mouth every morning.      Marland Kitchen morphine (MSIR) 30 MG tablet Take 30 mg by mouth 2 (two) times daily as needed for severe pain.      . Multiple Vitamins-Iron (MULTIVITAMINS WITH IRON) TABS tablet Take 1 tablet by mouth daily.      Marland Kitchen oxyCODONE (  OXY IR/ROXICODONE) 5 MG immediate release tablet Take 1-3 tablets (5-15 mg total) by mouth every 3 (three) hours as needed for severe pain.  90 tablet  0  . polyethylene glycol (MIRALAX / GLYCOLAX) packet Take 17 g by mouth daily as needed for mild constipation.        Results for orders placed during the hospital encounter of 01/13/14 (from the past 48 hour(s))  CBC     Status: Abnormal   Collection Time    01/13/14  6:40 AM      Result Value Ref Range   WBC 7.8  4.0 - 10.5 K/uL   RBC 4.18  3.87 - 5.11 MIL/uL   Hemoglobin 11.4 (*) 12.0 - 15.0 g/dL   HCT 34.5 (*) 36.0 - 46.0 %   MCV 82.5  78.0 - 100.0 fL   MCH 27.3  26.0 - 34.0 pg   MCHC 33.0  30.0 - 36.0 g/dL   RDW 14.1  11.5 - 15.5 %   Platelets 276  150 - 400 K/uL  BASIC METABOLIC PANEL     Status: Abnormal   Collection Time     01/13/14  6:40 AM      Result Value Ref Range   Sodium 139  137 - 147 mEq/L   Potassium 3.1 (*) 3.7 - 5.3 mEq/L   Chloride 101  96 - 112 mEq/L   CO2 28  19 - 32 mEq/L   Glucose, Bld 99  70 - 99 mg/dL   BUN 12  6 - 23 mg/dL   Creatinine, Ser 0.60  0.50 - 1.10 mg/dL   Calcium 9.5  8.4 - 10.5 mg/dL   GFR calc non Af Amer >90  >90 mL/min   GFR calc Af Amer >90  >90 mL/min   Comment: (NOTE)     The eGFR has been calculated using the CKD EPI equation.     This calculation has not been validated in all clinical situations.     eGFR's persistently <90 mL/min signify possible Chronic Kidney     Disease.   Anion gap 10  5 - 15   Dg Chest 2 View  01/13/2014   CLINICAL DATA:  Preop right hip and knee dislocation repair  EXAM: CHEST  2 VIEW  COMPARISON:  08/10/2013  FINDINGS: The heart size and mediastinal contours are within normal limits. Both lungs are clear. The visualized skeletal structures are unremarkable.  IMPRESSION: No active cardiopulmonary disease.   Electronically Signed   By: Franchot Gallo M.D.   On: 01/13/2014 07:21    Review of Systems  All other systems reviewed and are negative.   Blood pressure 128/81, pulse 107, temperature 98.1 F (36.7 C), temperature source Oral, resp. rate 18, height _0  (1.575 m), weight 70.364 kg (155 lb 2 oz), last menstrual period 01/02/2014, SpO2 97.00%. Physical Exam  Constitutional: She is oriented to person, place, and time. She appears well-developed and well-nourished.  HENT:  Head: Normocephalic and atraumatic.  Eyes: EOM are normal. Pupils are equal, round, and reactive to light.  Neck: Normal range of motion. Neck supple.  Cardiovascular: Normal rate and regular rhythm.   Respiratory: Effort normal and breath sounds normal.  GI: Soft. Bowel sounds are normal.  Musculoskeletal:       Right hip: She exhibits decreased range of motion.       Right knee: She exhibits decreased range of motion.  Neurological: She is alert and  oriented to person, place, and time.  Skin:  Skin is warm and dry.  Psychiatric: She has a normal mood and affect.     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 1)  To the OR today as an outpatient for a manipulation of her right hip and right knee under general anesthesia  Clemie [facility] 01/13/2014, 8:28 AM

## 2014-01-13 NOTE — Transfer of Care (Signed)
Immediate Anesthesia Transfer of Care Note  Patient: Lisa [name]  Procedure(s) Performed: Procedure(s): CLOSED MANIPULATION RIGHT HIP and RIGHT KNEE (Right) CLOSED MANIPULATION KNEE (Right)  Patient Location: PACU  Anesthesia Type:General  Level of Consciousness: awake, oriented, patient cooperative, lethargic and responds to stimulation  Airway & Oxygen Therapy: Patient Spontanous Breathing and Patient connected to face mask oxygen  Post-op Assessment: Report given to PACU RN, Post -op Vital signs reviewed and stable and Patient moving all extremities  Post vital signs: Reviewed and stable  Complications: No apparent anesthesia complications

## 2014-01-14 ENCOUNTER — Encounter (HOSPITAL_COMMUNITY): Payer: Self-pay | Admitting: Orthopaedic Surgery

## 2019-02-17 ENCOUNTER — Telehealth: Payer: Self-pay | Admitting: Orthopaedic Surgery

## 2019-02-17 NOTE — Telephone Encounter (Signed)
Pt called in said she had surgery with dr.blackman in 2015 and she got a prescription for a lift on her shoe for her left foot and [facility] is requesting we send over a new prescription to them.    [phone]

## 2019-02-17 NOTE — Telephone Encounter (Signed)
Faxed to [facility] 

## 2019-02-17 NOTE — Telephone Encounter (Signed)
That will be fine.  Please fill out a script for a new heel lift and I will sign it.  thanks

## 2019-02-17 NOTE — Telephone Encounter (Signed)
Can we get another Rx for her?

## 2020-11-07 ENCOUNTER — Telehealth: Payer: Self-pay | Admitting: Orthopaedic Surgery

## 2020-11-07 NOTE — Telephone Encounter (Signed)
Pt called stating she used to see Dr. Magnus Ivan and he wrote her a rx for a L shoe lift and now the pt is wanting a rx for the R shoe lift. Pt hasn't been seen since 2015 and wanted to know if she would have to be seen first? If not she would like to have this rx [facility]ed [facility].   [phone]

## 2020-11-08 ENCOUNTER — Ambulatory Visit: Payer: Self-pay | Admitting: Orthopaedic Surgery

## 2020-11-14 ENCOUNTER — Other Ambulatory Visit: Payer: Self-pay

## 2020-11-14 ENCOUNTER — Encounter: Payer: Self-pay | Admitting: Orthopaedic Surgery

## 2020-11-14 ENCOUNTER — Ambulatory Visit (INDEPENDENT_AMBULATORY_CARE_PROVIDER_SITE_OTHER): Payer: Medicare (Managed Care)

## 2020-11-14 ENCOUNTER — Ambulatory Visit (INDEPENDENT_AMBULATORY_CARE_PROVIDER_SITE_OTHER): Payer: Medicare (Managed Care) | Admitting: Orthopaedic Surgery

## 2020-11-14 VITALS — Ht 63.0 in | Wt 175.8 lb

## 2020-11-14 DIAGNOSIS — G8929 Other chronic pain: Secondary | ICD-10-CM | POA: Diagnosis not present

## 2020-11-14 DIAGNOSIS — M545 Low back pain, unspecified: Secondary | ICD-10-CM

## 2020-11-14 DIAGNOSIS — Z96641 Presence of right artificial hip joint: Secondary | ICD-10-CM | POA: Diagnosis not present

## 2020-11-14 DIAGNOSIS — M21751 Unequal limb length (acquired), right femur: Secondary | ICD-10-CM

## 2020-11-14 DIAGNOSIS — M2170 Unequal limb length (acquired), unspecified site: Secondary | ICD-10-CM

## 2020-11-14 NOTE — Progress Notes (Addendum)
Office Visit Note   Patient: Lisa [name]           Date of Birth: April 22, 1971           MRN: [id number] Visit Date: 11/14/2020              Requested by: Fleet Contras, MD [address] [place],  [place] [address] PCP: Fleet Contras, MD   Assessment & Plan: Visit Diagnoses:  1. Status post total replacement of right hip   2. Chronic bilateral low back pain without sciatica   3. History of total right hip replacement   4. Leg length discrepancy     Plan: At this point given the fact that her soft tissues are like so much more, I am recommending surgery for her right hip to hopefully improve her length.  Her original op note shows the femoral head is a 32+1.  Hopefully with removing scar tissue we can go up several sizes to improve her leg length that we will then improve her gait and decrease her back pain as well and hopefully offset the need for having such a large shoe buildup.  I showed her a hip model and explained in detail why I am recommending the surgery and what I think is worth of trying this and she agrees as well.  The risks and benefits of surgery and the interoperative postoperative course were described in detail.  We will be in touch about scheduling this case.  Follow-Up Instructions: Return for 2 weeks post-op.   Orders:  Orders Placed This Encounter  Procedures   XR HIP UNILAT W OR W/O PELVIS 2-3 VIEWS RIGHT   XR Lumbar Spine 2-3 Views   No orders of the defined types were placed in this encounter.     Procedures: No procedures performed   Clinical Data: No additional findings.   Subjective: Chief Complaint  Patient presents with   Lower Back - Pain   Right Hip - Pain  The patient is very well-known to me.  We actually replaced her right hip 7 years ago secondary to avascular necrosis.  She had such severe pain prior to surgery that she developed a significant right hip and right knee flexion contracture and made surgery quite difficult.   Due to the significant flexion contracture, we were able to replace her hip but not get her leg lengths out the length.  Over time she has had a large shoe buildup and is about a inch shorter on the right side than the left.  This is now affecting her gait and affecting her low back.  She has had no other acute change in her medical status.  She does report daily head pain but is manageable.  The biggest concern is the effect of her leg length discrepancy with the right side shorter than the left.  HPI  Review of Systems There is currently no headache, chest pain, shortness of breath, fever, chills, nausea, vomiting  Objective: Vital Signs: Ht 5\' 3"  (1.6 m)   Wt 175 lb 12.8 oz (79.7 kg)   BMI 31.14 kg/m   Physical Exam She is alert and oriented x3 and in no acute distress Ortho Exam Examination of her right hip shows no contracture of the right hip or the right knee.  The hip does move more smoothly.  There is a definite significant leg length discrepancy with the right side shorter than the left. Specialty Comments:  No specialty comments available.  Imaging: XR HIP  UNILAT W OR W/O PELVIS 2-3 VIEWS RIGHT  Result Date: 11/14/2020 An AP pelvis and lateral right hip shows a well-seated total hip arthroplasty with no complicating features or evidence of loosening.  There is a leg length discrepancy with the right side shorter than the left and heterotopic bone around the right hip.  XR Lumbar Spine 2-3 Views  Result Date: 11/14/2020 2 views lumbar spine showed no acute findings.    PMFS History: Patient Active Problem List   Diagnosis Date Noted   Leg length discrepancy 11/14/2020   History of total right hip replacement 11/14/2020   Arthrofibrosis of right knee joint 01/13/2014   Arthrofibrosis of right hip joint 01/13/2014   Avascular necrosis of bone of right hip (HCC) 10/22/2013   Status post THR (total hip replacement) 10/22/2013   Spontaneous pneumothorax 04/21/2012    Asthma 04/21/2012   Smoker 04/21/2012   Hypertension 04/21/2012   Past Medical History:  Diagnosis Date   Arthritis    Asthma    H/O: pneumothorax 2013   Hypertension     History reviewed. No pertinent family history.  Past Surgical History:  Procedure Laterality Date   ANKLE SURGERY     CESAREAN SECTION     HIP CLOSED REDUCTION Right 01/13/2014   Procedure: CLOSED MANIPULATION RIGHT HIP and RIGHT KNEE;  Surgeon: Kathryne Hitch, MD;  Location: [facility];  Service: Orthopedics;  Laterality: Right;   KNEE CLOSED REDUCTION Right 01/13/2014   Procedure: CLOSED MANIPULATION KNEE;  Surgeon: Kathryne Hitch, MD;  Location: [facility];  Service: Orthopedics;  Laterality: Right;   MOUTH SURGERY     TOTAL HIP ARTHROPLASTY Right 10/22/2013   Procedure: RIGHT TOTAL HIP ARTHROPLASTY ANTERIOR APPROACH;  Surgeon: Kathryne Hitch, MD;  Location: [facility];  Service: Orthopedics;  Laterality: Right;   Social History   Occupational History   Not on file  Tobacco Use   Smoking status: Every Day    Packs/day: 0.25    Years: 18.00    Pack years: 4.50    Types: Cigarettes   Smokeless tobacco: Not on file  Substance and Sexual Activity   Alcohol use: Yes    Comment: ocassional   Drug use: No   Sexual activity: Not on file

## 2020-12-14 ENCOUNTER — Other Ambulatory Visit: Payer: Self-pay

## 2020-12-22 ENCOUNTER — Other Ambulatory Visit: Payer: Self-pay | Admitting: Physician Assistant

## 2020-12-25 NOTE — Patient Instructions (Addendum)
DUE TO COVID-19 ONLY ONE VISITOR IS ALLOWED TO COME WITH YOU AND STAY IN THE WAITING ROOM ONLY DURING PRE OP AND PROCEDURE DAY OF SURGERY IF YOU ARE GOING HOME AFTER SURGERY. IF YOU ARE SPENDING THE NIGHT 2 PEOPLE MAY VISIT WITH YOU IN YOUR PRIVATE ROOM AFTER SURGERY UNTIL VISITING  HOURS ARE OVER AT 800 PM AND THE 2 VISITORS CANNOT SPEND THE NIGHT.   YOU NEED TO HAVE A COVID 19 TEST ON_9/7_THIS TEST MUST BE DONE BEFORE SURGERY,  COVID TESTING SITE  IS LOCATED AT [address], [place]. REMAIN IN YOUR CAR THIS IS A DRIVE UP TEST. AFTER YOUR COVID TEST PLEASE WEAR A MASK OUT IN PUBLIC AND SOCIAL DISTANCE AND WASH YOUR HANDS FREQUENTLY, ALSO ASK ALL YOUR CLOSE CONTACT PERSONS TO WEAR A MASK AND SOCIAL DISTANCE AND WASH THEIR HANDS FREQUENTLY ALSO.               Lisa [name]     Your procedure is scheduled on: 01/05/21   Report to [facility] Main  Entrance   Report to admitting at  9:30 AM     Call this number if you have problems the morning of surgery [phone]  ,   No food after midnight.    You may have clear liquid until 9:00 AM.    At 8:30 AM drink pre surgery drink.   Nothing by mouth after 9:00 AM.      CLEAR LIQUID DIET   Foods Allowed                                                                     Foods Excluded              NO MILK OR CREAMER  Coffee and tea, regular and decaf                             liquids that you cannot  Plain Jell-O any favor except red or purple                                           see through such as: Fruit ices (not with fruit pulp)                                     milk, soups, orange juice  Iced Popsicles                                    All solid food Carbonated beverages, regular and diet                                    Cranberry, grape and apple juices Sports drinks like Gatorade Lightly seasoned clear broth or consume(fat free) Sugar    BRUSH YOUR TEETH MORNING OF SURGERY AND RINSE YOUR  MOUTH OUT,  NO CHEWING GUM CANDY OR MINTS.     Take these medicines the morning of surgery with A SIP OF WATER: Amlodipine, Use your inhaler and bring it with you . Continue your ASA 81. You may call Dr. Magnus Ivan to verify  DO NOT TAKE ANY DIABETIC MEDICATIONS DAY OF YOUR SURGERY                               You may not have any metal on your body including hair pins and              piercings  Do not wear jewelry, make-up, lotions, powders or perfumes, deodorant             Do not wear nail polish on your fingernails or toes.  Do not shave  48 hours prior to surgery.               Do not bring valuables to the hospital. [facility] IS NOT             RESPONSIBLE   FOR VALUABLES.  Contacts, dentures or bridgework may not be worn into surgery.                    Please read over the following fact sheets you were given: _____________________________________________________________________             [facility] - Preparing for Surgery Before surgery, you can play an important role.  Because skin is not sterile, your skin needs to be as free of germs as possible.  You can reduce the number of germs on your skin by washing with CHG (chlorahexidine gluconate) soap before surgery.  CHG is an antiseptic cleaner which kills germs and bonds with the skin to continue killing germs even after washing. Please DO NOT use if you have an allergy to CHG or antibacterial soaps.  If your skin becomes reddened/irritated stop using the CHG and inform your nurse when you arrive at Short Stay.  You may shave your face/neck. Please follow these instructions carefully:  1.  Shower with CHG Soap the night before surgery and the  morning of Surgery.  2.  If you choose to wash your hair, wash your hair first as usual with your  normal  shampoo.  3.  After you shampoo, rinse your hair and body thoroughly to remove the  shampoo.                            4.  Use CHG as you would any other liquid soap.  You can  apply chg directly  to the skin and wash                       Gently with a scrungie or clean washcloth.  5.  Apply the CHG Soap to your body ONLY FROM THE NECK DOWN.   Do not use on face/ open                           Wound or open sores. Avoid contact with eyes, ears mouth and genitals (private parts).                       Wash face,  Genitals (private parts) with your normal soap.  6.  Wash thoroughly, paying special attention to the area where your surgery  will be performed.  7.  Thoroughly rinse your body with warm water from the neck down.  8.  DO NOT shower/wash with your normal soap after using and rinsing off  the CHG Soap.                9.  Pat yourself dry with a clean towel.            10.  Wear clean pajamas.            11.  Place clean sheets on your bed the night of your first shower and do not  sleep with pets. Day of Surgery : Do not apply any lotions/deodorants the morning of surgery.  Please wear clean clothes to the hospital/surgery center.  FAILURE TO FOLLOW THESE INSTRUCTIONS MAY RESULT IN THE CANCELLATION OF YOUR SURGERY PATIENT SIGNATURE_________________________________  NURSE SIGNATURE__________________________________  ________________________________________________________________________   Lisa [name]  An incentive spirometer is a tool that can help keep your lungs clear and active. This tool measures how well you are filling your lungs with each breath. Taking long deep breaths may help reverse or decrease the chance of developing breathing (pulmonary) problems (especially infection) following: A long period of time when you are unable to move or be active. BEFORE THE PROCEDURE  If the spirometer includes an indicator to show your best effort, your nurse or respiratory therapist will set it to a desired goal. If possible, sit up straight or lean slightly forward. Try not to slouch. Hold the incentive spirometer in an upright  position. INSTRUCTIONS FOR USE  Sit on the edge of your bed if possible, or sit up as far as you can in bed or on a chair. Hold the incentive spirometer in an upright position. Breathe out normally. Place the mouthpiece in your mouth and seal your lips tightly around it. Breathe in slowly and as deeply as possible, raising the piston or the ball toward the top of the column. Hold your breath for 3-5 seconds or for as long as possible. Allow the piston or ball to fall to the bottom of the column. Remove the mouthpiece from your mouth and breathe out normally. Rest for a few seconds and repeat Steps 1 through 7 at least 10 times every 1-2 hours when you are awake. Take your time and take a few normal breaths between deep breaths. The spirometer may include an indicator to show your best effort. Use the indicator as a goal to work toward during each repetition. After each set of 10 deep breaths, practice coughing to be sure your lungs are clear. If you have an incision (the cut made at the time of surgery), support your incision when coughing by placing a pillow or rolled up towels firmly against it. Once you are able to get out of bed, walk around indoors and cough well. You may stop using the incentive spirometer when instructed by your caregiver.  RISKS AND COMPLICATIONS Take your time so you do not get dizzy or light-headed. If you are in pain, you may need to take or ask for pain medication before doing incentive spirometry. It is harder to take a deep breath if you are having pain. AFTER USE Rest and breathe slowly and easily. It can be helpful to keep track of a log of your progress. Your caregiver can provide you with a simple table to help with this. If you are using  the spirometer at home, follow these instructions: [place] IF:  You are having difficultly using the spirometer. You have trouble using the spirometer as often as instructed. Your pain medication is not giving  enough relief while using the spirometer. You develop fever of 100.5 F (38.1 C) or higher. SEEK IMMEDIATE MEDICAL CARE IF:  You cough up bloody sputum that had not been present before. You develop fever of 102 F (38.9 C) or greater. You develop worsening pain at or near the incision site. MAKE SURE YOU:  Understand these instructions. Will watch your condition. Will get help right away if you are not doing well or get worse. Document Released: 08/26/2006 Document Revised: 07/08/2011 Document Reviewed: 10/27/2006 [facility] Patient Information 2014 [place], [place].   ________________________________________________________________________

## 2020-12-26 ENCOUNTER — Other Ambulatory Visit: Payer: Self-pay

## 2020-12-26 ENCOUNTER — Encounter (HOSPITAL_COMMUNITY): Payer: Self-pay

## 2020-12-26 ENCOUNTER — Encounter (HOSPITAL_COMMUNITY)
Admission: RE | Admit: 2020-12-26 | Discharge: 2020-12-26 | Disposition: A | Discharge status: Home / Self Care | Payer: Medicare (Managed Care) | Source: Ambulatory Visit | Attending: Orthopaedic Surgery | Admitting: Orthopaedic Surgery

## 2020-12-26 DIAGNOSIS — Z01818 Encounter for other preprocedural examination: Secondary | ICD-10-CM | POA: Insufficient documentation

## 2020-12-26 HISTORY — DX: Type 2 diabetes mellitus without complications: E11.9

## 2020-12-26 LAB — SURGICAL PCR SCREEN
MRSA, PCR: NEGATIVE
Staphylococcus aureus: NEGATIVE

## 2020-12-26 LAB — GLUCOSE, CAPILLARY: Glucose-Capillary: 161 mg/dL — ABNORMAL HIGH (ref 70–99)

## 2020-12-26 NOTE — Progress Notes (Addendum)
COVID test Completed:9/7    PCP - Dr. Constance Holster LOV 12/01/20 Cardiologist - no Pulmonologist- Dr. Leane Para LOV 10/29/20  Chest x-ray - no EKG - 12/26/20 -chart Stress Test - no ECHO - no Cardiac Cath - no Pacemaker/ICD device last checked:NA  Sleep Study - NA CPAP -   Fasting Blood Sugar - 120-140 Checks Blood Sugar _QD____ times a day  Blood Thinner Instructions:ASA 81/ Dr. Robin Searing Aspirin Instructions:ok to continue/ Magnus Ivan Last Dose:  Anesthesia review: Yes  Patient denies shortness of breath, fever, cough and chest pain at PAT appointment yes  Pt reports SOB climbing one flight of stairs.  She sometimes gets SOB doing house work. She uses her inhaler daily and this is baseline. Her asthma is triggered by heat, humidity and smells.  Patient verbalized understanding of instructions that were given to them at the PAT appointment. Patient was also instructed that they will need to review over the PAT instructions again at home before surgery. yes

## 2021-01-03 ENCOUNTER — Other Ambulatory Visit: Payer: Self-pay | Admitting: Orthopaedic Surgery

## 2021-01-03 LAB — SARS CORONAVIRUS 2 (TAT 6-24 HRS): SARS Coronavirus 2: NEGATIVE

## 2021-01-04 NOTE — H&P (Signed)
Lisa [name] is an 50 y.o. female.   Chief Complaint: Leg length difference status post right hip replacement HPI: The patient is a 50 year old female who had a right total hip arthroplasty about 7 years ago secondary to severe avascular necrosis.  Prior to surgery, she had such severe right hip pain that she developed flexion contractures of her right hip and her right knee.  This made the hip replacement significantly difficult to perform.  The hip replacement was performed and eventually she did well, however, there was a significant leg length discrepancy with the right side shorter than the left due to the difficulty getting the hip reduced.  Over time her pain is decreased and she improved the range of motion of her right hip and right knee.  However, there is a significant leg length discrepancy with her right side now shorter than the left and she has to wear a significant shoe buildup on the right side.  I have recommended a revision of her right hip arthroplasty with hopefully increasing her length going up several hip ball sizes if possible.  Past Medical History:  Diagnosis Date   Arthritis    hips, back   Asthma    Diabetes mellitus without complication (HCC)    H/O: pneumothorax 04/30/2011   Hypertension     Past Surgical History:  Procedure Laterality Date   ANKLE SURGERY     CESAREAN SECTION     HIP CLOSED REDUCTION Right 01/13/2014   Procedure: CLOSED MANIPULATION RIGHT HIP and RIGHT KNEE;  Surgeon: Kathryne Hitch, MD;  Location: [facility];  Service: Orthopedics;  Laterality: Right;   KNEE CLOSED REDUCTION Right 01/13/2014   Procedure: CLOSED MANIPULATION KNEE;  Surgeon: Kathryne Hitch, MD;  Location: [facility];  Service: Orthopedics;  Laterality: Right;   MOUTH SURGERY     TOTAL HIP ARTHROPLASTY Right 10/22/2013   Procedure: RIGHT TOTAL HIP ARTHROPLASTY ANTERIOR APPROACH;  Surgeon: Kathryne Hitch, MD;  Location: [facility];  Service: Orthopedics;  Laterality:  Right;    No family history on file. Social History:  reports that she quit smoking about 3 years ago. Her smoking use included cigarettes. She has a 4.50 pack-year smoking history. She has never used smokeless tobacco. She reports current alcohol use. She reports that she does not use drugs.  Allergies:  Allergies  Allergen Reactions   Penicillins Swelling and Rash    No medications prior to admission.    Results for orders placed or performed in visit on 01/03/21 (from the past 48 hour(s))  SARS Coronavirus 2 (TAT 6-24 hrs)     Status: None   Collection Time: 01/03/21 12:00 AM  Result Value Ref Range   SARS Coronavirus 2 RESULT: NEGATIVE     Comment: RESULT: NEGATIVESARS-CoV-2 INTERPRETATION:A NEGATIVE  test result means that SARS-CoV-2 RNA was not present in the specimen above the limit of detection of this test. This does not preclude a possible SARS-CoV-2 infection and should not be used as the  sole basis for patient management decisions. Negative results must be combined with clinical observations, patient history, and epidemiological information. Optimum specimen types and timing for peak viral levels during infections caused by SARS-CoV-2  have not been determined. Collection of multiple specimens or types of specimens may be necessary to detect virus. Improper specimen collection and handling, sequence variability under primers/probes, or organism present below the limit of detection may  lead to false negative results. Positive and negative predictive values of testing are highly  dependent on prevalence. False negative test results are more likely when prevalence of disease is high.The expected result is NEGATIVE.Fact S heet for  Healthcare Providers: [employer] Sheet for Patients: https://poole-freeman.org/ Reference Range - Negative    No results found.  Review of Systems  Musculoskeletal:  Positive for back pain and  gait problem.  All other systems reviewed and are negative.  There were no vitals taken for this visit. Physical Exam Vitals reviewed.  Constitutional:      Appearance: Normal appearance.  HENT:     Head: Normocephalic and atraumatic.  Eyes:     Extraocular Movements: Extraocular movements intact.     Pupils: Pupils are equal, round, and reactive to light.  Cardiovascular:     Rate and Rhythm: Normal rate and regular rhythm.  Pulmonary:     Effort: Pulmonary effort is normal.     Breath sounds: Normal breath sounds.  Abdominal:     Palpations: Abdomen is soft.  Musculoskeletal:     Cervical back: Normal range of motion and neck supple.  Neurological:     Mental Status: She is alert and oriented to person, place, and time.  Psychiatric:        Behavior: Behavior normal.    Her right hip moves smoothly and fluidly.  When the patient is placed in a supine position, there is a leg length discrepancy with the right side significantly shorter than the left.  Assessment/Plan Significant leg length discrepancy with the right leg shorter than the left status post a right total hip arthroplasty  The plan will be to proceed to surgery for a revision arthroplasty of the right hip with hopefully removing scar tissue, potentially changing out the polyethylene liner and going up a few hip ball sizes in order to hopefully restore her leg lengths to something more equal bilaterally which will then hopefully improve her gait and decrease her back pain.  The risks and benefits of surgery been explained in detail.  Kathryne Hitch, MD 01/04/2021, 6:51 PM

## 2021-01-05 ENCOUNTER — Inpatient Hospital Stay (HOSPITAL_COMMUNITY): Payer: Medicare (Managed Care)

## 2021-01-05 ENCOUNTER — Inpatient Hospital Stay (HOSPITAL_COMMUNITY): Payer: Medicare (Managed Care) | Admitting: Physician Assistant

## 2021-01-05 ENCOUNTER — Encounter (HOSPITAL_COMMUNITY): Payer: Self-pay | Admitting: Orthopaedic Surgery

## 2021-01-05 ENCOUNTER — Encounter (HOSPITAL_COMMUNITY)
Admission: RE | Disposition: A | Discharge status: Home / Self Care | Payer: Self-pay | Source: Home / Self Care | Attending: Orthopaedic Surgery

## 2021-01-05 ENCOUNTER — Inpatient Hospital Stay (HOSPITAL_COMMUNITY)
Admission: RE | Admit: 2021-01-05 | Discharge: 2021-01-07 | DRG: 468 | Disposition: A | Discharge status: Home / Self Care | Payer: Medicare (Managed Care) | Attending: Orthopaedic Surgery | Admitting: Orthopaedic Surgery

## 2021-01-05 ENCOUNTER — Other Ambulatory Visit: Payer: Self-pay

## 2021-01-05 ENCOUNTER — Inpatient Hospital Stay (HOSPITAL_COMMUNITY): Payer: Medicare (Managed Care) | Admitting: Anesthesiology

## 2021-01-05 DIAGNOSIS — Z880 Allergy status to penicillin: Secondary | ICD-10-CM | POA: Diagnosis not present

## 2021-01-05 DIAGNOSIS — M24561 Contracture, right knee: Secondary | ICD-10-CM | POA: Diagnosis present

## 2021-01-05 DIAGNOSIS — J45909 Unspecified asthma, uncomplicated: Secondary | ICD-10-CM | POA: Diagnosis present

## 2021-01-05 DIAGNOSIS — M9689 Other intraoperative and postprocedural complications and disorders of the musculoskeletal system: Secondary | ICD-10-CM

## 2021-01-05 DIAGNOSIS — Z20822 Contact with and (suspected) exposure to covid-19: Secondary | ICD-10-CM | POA: Diagnosis present

## 2021-01-05 DIAGNOSIS — M549 Dorsalgia, unspecified: Secondary | ICD-10-CM | POA: Diagnosis present

## 2021-01-05 DIAGNOSIS — S72041A Displaced fracture of base of neck of right femur, initial encounter for closed fracture: Secondary | ICD-10-CM

## 2021-01-05 DIAGNOSIS — M21751 Unequal limb length (acquired), right femur: Principal | ICD-10-CM | POA: Diagnosis present

## 2021-01-05 DIAGNOSIS — Z96641 Presence of right artificial hip joint: Secondary | ICD-10-CM | POA: Diagnosis present

## 2021-01-05 DIAGNOSIS — Z96651 Presence of right artificial knee joint: Secondary | ICD-10-CM | POA: Diagnosis not present

## 2021-01-05 DIAGNOSIS — Z96649 Presence of unspecified artificial hip joint: Secondary | ICD-10-CM

## 2021-01-05 DIAGNOSIS — E119 Type 2 diabetes mellitus without complications: Secondary | ICD-10-CM | POA: Diagnosis present

## 2021-01-05 DIAGNOSIS — Z87891 Personal history of nicotine dependence: Secondary | ICD-10-CM

## 2021-01-05 DIAGNOSIS — T84090A Other mechanical complication of internal right hip prosthesis, initial encounter: Secondary | ICD-10-CM | POA: Diagnosis not present

## 2021-01-05 DIAGNOSIS — I10 Essential (primary) hypertension: Secondary | ICD-10-CM | POA: Diagnosis present

## 2021-01-05 DIAGNOSIS — M2170 Unequal limb length (acquired), unspecified site: Secondary | ICD-10-CM

## 2021-01-05 HISTORY — PX: ANTERIOR HIP REVISION: SHX6527

## 2021-01-05 LAB — CBC
HCT: 40 % (ref 36.0–46.0)
Hemoglobin: 13.1 g/dL (ref 12.0–15.0)
MCH: 29.4 pg (ref 26.0–34.0)
MCHC: 32.8 g/dL (ref 30.0–36.0)
MCV: 89.7 fL (ref 80.0–100.0)
Platelets: 323 10*3/uL (ref 150–400)
RBC: 4.46 MIL/uL (ref 3.87–5.11)
RDW: 13.6 % (ref 11.5–15.5)
WBC: 10.2 10*3/uL (ref 4.0–10.5)
nRBC: 0 % (ref 0.0–0.2)

## 2021-01-05 LAB — COMPREHENSIVE METABOLIC PANEL
ALT: 15 U/L (ref 0–44)
AST: 23 U/L (ref 15–41)
Albumin: 4.1 g/dL (ref 3.5–5.0)
Alkaline Phosphatase: 82 U/L (ref 38–126)
Anion gap: 10 (ref 5–15)
BUN: 12 mg/dL (ref 6–20)
CO2: 28 mmol/L (ref 22–32)
Calcium: 9.9 mg/dL (ref 8.9–10.3)
Chloride: 100 mmol/L (ref 98–111)
Creatinine, Ser: 0.81 mg/dL (ref 0.44–1.00)
GFR, Estimated: >60 mL/min (ref >60)
Glucose, Bld: 131 mg/dL — ABNORMAL HIGH (ref 70–99)
Potassium: 3.8 mmol/L (ref 3.5–5.1)
Sodium: 138 mmol/L (ref 135–145)
Total Bilirubin: 0.5 mg/dL (ref 0.3–1.2)
Total Protein: 8.3 g/dL — ABNORMAL HIGH (ref 6.5–8.1)

## 2021-01-05 LAB — TYPE AND SCREEN
ABO/RH(D): O POS
Antibody Screen: NEGATIVE

## 2021-01-05 LAB — GLUCOSE, CAPILLARY
Glucose-Capillary: 148 mg/dL — ABNORMAL HIGH (ref 70–99)
Glucose-Capillary: 192 mg/dL — ABNORMAL HIGH (ref 70–99)

## 2021-01-05 LAB — HCG, SERUM, QUALITATIVE: Preg, Serum: NEGATIVE

## 2021-01-05 SURGERY — REVISION, TOTAL ARTHROPLASTY, HIP, ANTERIOR APPROACH
Anesthesia: Spinal | Site: Hip | Laterality: Right

## 2021-01-05 MED ORDER — FENTANYL CITRATE (PF) 100 MCG/2ML IJ SOLN
INTRAMUSCULAR | Status: AC
  Filled 2021-01-05: qty 2

## 2021-01-05 MED ORDER — METFORMIN HCL ER 500 MG PO TB24
500.0000 mg | ORAL_TABLET | Freq: Two times a day (BID) | ORAL | Status: DC
  Administered 2021-01-06 – 2021-01-07 (×3): 500 mg via ORAL
  Filled 2021-01-05 (×3): qty 1

## 2021-01-05 MED ORDER — PANTOPRAZOLE SODIUM 40 MG PO TBEC
40.0000 mg | DELAYED_RELEASE_TABLET | Freq: Every day | ORAL | Status: DC
  Administered 2021-01-05 – 2021-01-07 (×3): 40 mg via ORAL
  Filled 2021-01-05 (×3): qty 1

## 2021-01-05 MED ORDER — UMECLIDINIUM BROMIDE 62.5 MCG/INH IN AEPB
1.0000 | INHALATION_SPRAY | Freq: Every day | RESPIRATORY_TRACT | Status: DC
  Administered 2021-01-06 – 2021-01-07 (×2): 1 via RESPIRATORY_TRACT
  Filled 2021-01-05: qty 7

## 2021-01-05 MED ORDER — STERILE WATER FOR IRRIGATION IR SOLN
Status: DC | PRN
  Administered 2021-01-05: 2000 mL

## 2021-01-05 MED ORDER — AMLODIPINE BESYLATE 5 MG PO TABS
5.0000 mg | ORAL_TABLET | Freq: Every morning | ORAL | Status: DC
  Administered 2021-01-06 – 2021-01-07 (×2): 5 mg via ORAL
  Filled 2021-01-05 (×2): qty 1

## 2021-01-05 MED ORDER — MONTELUKAST SODIUM 10 MG PO TABS
10.0000 mg | ORAL_TABLET | Freq: Every day | ORAL | Status: DC
  Administered 2021-01-05 – 2021-01-06 (×2): 10 mg via ORAL
  Filled 2021-01-05 (×2): qty 1

## 2021-01-05 MED ORDER — ASPIRIN 81 MG PO CHEW
81.0000 mg | CHEWABLE_TABLET | Freq: Two times a day (BID) | ORAL | Status: DC
  Administered 2021-01-05 – 2021-01-07 (×4): 81 mg via ORAL
  Filled 2021-01-05 (×4): qty 1

## 2021-01-05 MED ORDER — FLUTICASONE-UMECLIDIN-VILANT 200-62.5-25 MCG/INH IN AEPB: 1.0000 | INHALATION_SPRAY | Freq: Every day | RESPIRATORY_TRACT | Status: DC

## 2021-01-05 MED ORDER — VITAMIN D 25 MCG (1000 UNIT) PO TABS
1000.0000 [IU] | ORAL_TABLET | Freq: Every day | ORAL | Status: DC
  Administered 2021-01-06 – 2021-01-07 (×2): 1000 [IU] via ORAL
  Filled 2021-01-05 (×2): qty 1

## 2021-01-05 MED ORDER — OXYCODONE HCL 5 MG PO TABS: 5.0000 mg | ORAL_TABLET | Freq: Once | ORAL | Status: DC | PRN

## 2021-01-05 MED ORDER — LOSARTAN POTASSIUM 50 MG PO TABS
100.0000 mg | ORAL_TABLET | Freq: Every day | ORAL | Status: DC
  Administered 2021-01-06 – 2021-01-07 (×2): 100 mg via ORAL
  Filled 2021-01-05 (×2): qty 2

## 2021-01-05 MED ORDER — PROPOFOL 500 MG/50ML IV EMUL
INTRAVENOUS | Status: DC | PRN
  Administered 2021-01-05: 50 ug/kg/min via INTRAVENOUS

## 2021-01-05 MED ORDER — DEXMEDETOMIDINE (PRECEDEX) IN NS 20 MCG/5ML (4 MCG/ML) IV SYRINGE
PREFILLED_SYRINGE | INTRAVENOUS | Status: DC | PRN
  Administered 2021-01-05 (×2): 8 ug via INTRAVENOUS

## 2021-01-05 MED ORDER — DIPHENHYDRAMINE HCL 12.5 MG/5ML PO ELIX: 12.5000 mg | ORAL_SOLUTION | ORAL | Status: DC | PRN

## 2021-01-05 MED ORDER — CHLORHEXIDINE GLUCONATE 0.12 % MT SOLN
15.0000 mL | Freq: Once | OROMUCOSAL | Status: AC
  Administered 2021-01-05: 15 mL via OROMUCOSAL

## 2021-01-05 MED ORDER — OXYCODONE HCL 5 MG PO TABS
10.0000 mg | ORAL_TABLET | ORAL | Status: DC | PRN
  Administered 2021-01-06 – 2021-01-07 (×7): 15 mg via ORAL
  Filled 2021-01-05 (×7): qty 3

## 2021-01-05 MED ORDER — ONDANSETRON HCL 4 MG/2ML IJ SOLN
INTRAMUSCULAR | Status: DC | PRN
  Administered 2021-01-05: 4 mg via INTRAVENOUS

## 2021-01-05 MED ORDER — DOCUSATE SODIUM 100 MG PO CAPS
100.0000 mg | ORAL_CAPSULE | Freq: Two times a day (BID) | ORAL | Status: DC
  Administered 2021-01-05 – 2021-01-07 (×4): 100 mg via ORAL
  Filled 2021-01-05 (×4): qty 1

## 2021-01-05 MED ORDER — SODIUM CHLORIDE 0.9 % IV SOLN: INTRAVENOUS | Status: DC

## 2021-01-05 MED ORDER — POLYETHYLENE GLYCOL 3350 17 G PO PACK
17.0000 g | PACK | Freq: Every day | ORAL | Status: DC | PRN
Start: 2021-01-05 — End: 2021-01-07

## 2021-01-05 MED ORDER — HYDROMORPHONE HCL 1 MG/ML IJ SOLN
INTRAMUSCULAR | Status: AC
  Filled 2021-01-05: qty 1

## 2021-01-05 MED ORDER — DEXAMETHASONE SODIUM PHOSPHATE 10 MG/ML IJ SOLN
INTRAMUSCULAR | Status: DC | PRN
  Administered 2021-01-05: 4 mg via INTRAVENOUS

## 2021-01-05 MED ORDER — TRANEXAMIC ACID-NACL 1000-0.7 MG/100ML-% IV SOLN
1000.0000 mg | INTRAVENOUS | Status: AC
  Administered 2021-01-05: 1000 mg via INTRAVENOUS

## 2021-01-05 MED ORDER — BUPIVACAINE IN DEXTROSE 0.75-8.25 % IT SOLN
INTRATHECAL | Status: DC | PRN
  Administered 2021-01-05: 1.8 mL via INTRATHECAL

## 2021-01-05 MED ORDER — ACETAMINOPHEN 10 MG/ML IV SOLN: 1000.0000 mg | Freq: Once | INTRAVENOUS | Status: DC | PRN

## 2021-01-05 MED ORDER — HYDROMORPHONE HCL 1 MG/ML IJ SOLN: 0.5000 mg | INTRAMUSCULAR | Status: DC | PRN

## 2021-01-05 MED ORDER — PROPOFOL 10 MG/ML IV BOLUS
INTRAVENOUS | Status: DC | PRN
  Administered 2021-01-05 (×3): 20 mg via INTRAVENOUS

## 2021-01-05 MED ORDER — POVIDONE-IODINE 10 % EX SWAB
2.0000 "application " | Freq: Once | CUTANEOUS | Status: AC
  Administered 2021-01-05: 2 via TOPICAL

## 2021-01-05 MED ORDER — SODIUM CHLORIDE 0.9 % IR SOLN
Status: DC | PRN
  Administered 2021-01-05: 1000 mL

## 2021-01-05 MED ORDER — ONDANSETRON HCL 4 MG/2ML IJ SOLN: 4.0000 mg | Freq: Four times a day (QID) | INTRAMUSCULAR | Status: DC | PRN

## 2021-01-05 MED ORDER — CLINDAMYCIN PHOSPHATE 900 MG/50ML IV SOLN
900.0000 mg | INTRAVENOUS | Status: AC
  Administered 2021-01-05: 900 mg via INTRAVENOUS

## 2021-01-05 MED ORDER — LOTEPREDNOL ETABONATE 0.25 % OP SUSP
1.0000 [drp] | Freq: Every day | OPHTHALMIC | Status: DC
  Administered 2021-01-06 – 2021-01-07 (×2): 1 [drp] via OPHTHALMIC

## 2021-01-05 MED ORDER — FLUTICASONE FUROATE-VILANTEROL 200-25 MCG/INH IN AEPB
1.0000 | INHALATION_SPRAY | Freq: Every day | RESPIRATORY_TRACT | Status: DC
  Administered 2021-01-06 – 2021-01-07 (×2): 1 via RESPIRATORY_TRACT
  Filled 2021-01-05: qty 28

## 2021-01-05 MED ORDER — OXYCODONE HCL 5 MG/5ML PO SOLN: 5.0000 mg | Freq: Once | ORAL | Status: DC | PRN

## 2021-01-05 MED ORDER — METHOCARBAMOL 500 MG IVPB - SIMPLE MED
INTRAVENOUS | Status: AC
  Filled 2021-01-05: qty 50

## 2021-01-05 MED ORDER — IPRATROPIUM-ALBUTEROL 0.5-2.5 (3) MG/3ML IN SOLN
3.0000 mL | Freq: Four times a day (QID) | RESPIRATORY_TRACT | Status: DC | PRN
  Administered 2021-01-05: 3 mL via RESPIRATORY_TRACT
  Filled 2021-01-05: qty 3

## 2021-01-05 MED ORDER — PHENYLEPHRINE 40 MCG/ML (10ML) SYRINGE FOR IV PUSH (FOR BLOOD PRESSURE SUPPORT)
PREFILLED_SYRINGE | INTRAVENOUS | Status: DC | PRN
Start: 2021-01-05 — End: 2021-01-05
  Administered 2021-01-05 (×4): 80 ug via INTRAVENOUS
  Administered 2021-01-05: 40 ug via INTRAVENOUS
  Administered 2021-01-05 (×2): 80 ug via INTRAVENOUS
  Administered 2021-01-05: 40 ug via INTRAVENOUS

## 2021-01-05 MED ORDER — PHENYLEPHRINE HCL (PRESSORS) 10 MG/ML IV SOLN
INTRAVENOUS | Status: AC
  Filled 2021-01-05: qty 2

## 2021-01-05 MED ORDER — TRANEXAMIC ACID-NACL 1000-0.7 MG/100ML-% IV SOLN
INTRAVENOUS | Status: AC
  Filled 2021-01-05: qty 100

## 2021-01-05 MED ORDER — LOSARTAN POTASSIUM-HCTZ 100-25 MG PO TABS: 1.0000 | ORAL_TABLET | Freq: Every morning | ORAL | Status: DC

## 2021-01-05 MED ORDER — ASCORBIC ACID 500 MG PO TABS
1000.0000 mg | ORAL_TABLET | Freq: Every day | ORAL | Status: DC
  Administered 2021-01-06 – 2021-01-07 (×2): 1000 mg via ORAL
  Filled 2021-01-05 (×2): qty 2

## 2021-01-05 MED ORDER — FENTANYL CITRATE (PF) 100 MCG/2ML IJ SOLN
INTRAMUSCULAR | Status: DC | PRN
  Administered 2021-01-05: 50 ug via INTRAVENOUS
  Administered 2021-01-05 (×2): 25 ug via INTRAVENOUS

## 2021-01-05 MED ORDER — 0.9 % SODIUM CHLORIDE (POUR BTL) OPTIME
TOPICAL | Status: DC | PRN
  Administered 2021-01-05: 1000 mL

## 2021-01-05 MED ORDER — PROMETHAZINE HCL 25 MG/ML IJ SOLN: 6.2500 mg | INTRAMUSCULAR | Status: DC | PRN

## 2021-01-05 MED ORDER — ORAL CARE MOUTH RINSE: 15.0000 mL | Freq: Once | OROMUCOSAL | Status: AC

## 2021-01-05 MED ORDER — METHOCARBAMOL 500 MG PO TABS: 500.0000 mg | ORAL_TABLET | Freq: Four times a day (QID) | ORAL | Status: DC | PRN

## 2021-01-05 MED ORDER — CLINDAMYCIN PHOSPHATE 600 MG/50ML IV SOLN
600.0000 mg | Freq: Four times a day (QID) | INTRAVENOUS | Status: AC
  Administered 2021-01-05 – 2021-01-06 (×2): 600 mg via INTRAVENOUS
  Filled 2021-01-05 (×2): qty 50

## 2021-01-05 MED ORDER — ONDANSETRON HCL 4 MG/2ML IJ SOLN
INTRAMUSCULAR | Status: AC
  Filled 2021-01-05: qty 2

## 2021-01-05 MED ORDER — ALBUTEROL SULFATE (2.5 MG/3ML) 0.083% IN NEBU
3.0000 mL | INHALATION_SOLUTION | Freq: Four times a day (QID) | RESPIRATORY_TRACT | Status: DC | PRN
  Administered 2021-01-06 – 2021-01-07 (×2): 3 mL via RESPIRATORY_TRACT
  Filled 2021-01-05 (×2): qty 3

## 2021-01-05 MED ORDER — ACETAMINOPHEN 325 MG PO TABS
325.0000 mg | ORAL_TABLET | Freq: Four times a day (QID) | ORAL | Status: DC | PRN
Start: 2021-01-06 — End: 2021-01-07
  Administered 2021-01-07: 650 mg via ORAL
  Filled 2021-01-05: qty 2

## 2021-01-05 MED ORDER — LACTATED RINGERS IV SOLN: INTRAVENOUS | Status: DC

## 2021-01-05 MED ORDER — METOCLOPRAMIDE HCL 5 MG PO TABS
5.0000 mg | ORAL_TABLET | Freq: Three times a day (TID) | ORAL | Status: DC | PRN
Start: 2021-01-05 — End: 2021-01-07

## 2021-01-05 MED ORDER — PROPOFOL 1000 MG/100ML IV EMUL
INTRAVENOUS | Status: AC
  Filled 2021-01-05: qty 100

## 2021-01-05 MED ORDER — ALUM & MAG HYDROXIDE-SIMETH 200-200-20 MG/5ML PO SUSP: 30.0000 mL | ORAL | Status: DC | PRN

## 2021-01-05 MED ORDER — CLINDAMYCIN PHOSPHATE 900 MG/50ML IV SOLN
INTRAVENOUS | Status: AC
  Filled 2021-01-05: qty 50

## 2021-01-05 MED ORDER — METOCLOPRAMIDE HCL 5 MG/ML IJ SOLN: 5.0000 mg | Freq: Three times a day (TID) | INTRAMUSCULAR | Status: DC | PRN

## 2021-01-05 MED ORDER — MIDAZOLAM HCL 2 MG/2ML IJ SOLN
INTRAMUSCULAR | Status: AC
  Filled 2021-01-05: qty 2

## 2021-01-05 MED ORDER — PHENYLEPHRINE HCL-NACL 20-0.9 MG/250ML-% IV SOLN
INTRAVENOUS | Status: DC | PRN
  Administered 2021-01-05: 40 ug/min via INTRAVENOUS

## 2021-01-05 MED ORDER — HYDROMORPHONE HCL 1 MG/ML IJ SOLN
0.2500 mg | INTRAMUSCULAR | Status: DC | PRN
  Administered 2021-01-05 (×2): 0.5 mg via INTRAVENOUS

## 2021-01-05 MED ORDER — ONDANSETRON HCL 4 MG PO TABS: 4.0000 mg | ORAL_TABLET | Freq: Four times a day (QID) | ORAL | Status: DC | PRN

## 2021-01-05 MED ORDER — OXYCODONE HCL 5 MG PO TABS
5.0000 mg | ORAL_TABLET | ORAL | Status: DC | PRN
  Administered 2021-01-05: 10 mg via ORAL
  Filled 2021-01-05: qty 2

## 2021-01-05 MED ORDER — HYDROCHLOROTHIAZIDE 25 MG PO TABS
25.0000 mg | ORAL_TABLET | Freq: Every day | ORAL | Status: DC
  Administered 2021-01-06 – 2021-01-07 (×2): 25 mg via ORAL
  Filled 2021-01-05 (×2): qty 1

## 2021-01-05 MED ORDER — MIDAZOLAM HCL 5 MG/5ML IJ SOLN
INTRAMUSCULAR | Status: DC | PRN
  Administered 2021-01-05: 2 mg via INTRAVENOUS

## 2021-01-05 MED ORDER — MENTHOL 3 MG MT LOZG: 1.0000 | LOZENGE | OROMUCOSAL | Status: DC | PRN

## 2021-01-05 MED ORDER — METHOCARBAMOL 500 MG IVPB - SIMPLE MED
500.0000 mg | Freq: Four times a day (QID) | INTRAVENOUS | Status: DC | PRN
  Administered 2021-01-05: 500 mg via INTRAVENOUS
  Filled 2021-01-05: qty 50

## 2021-01-05 MED ORDER — CYCLOSPORINE 0.05 % OP EMUL
1.0000 [drp] | Freq: Two times a day (BID) | OPHTHALMIC | Status: DC
  Administered 2021-01-05 – 2021-01-07 (×4): 1 [drp] via OPHTHALMIC
  Filled 2021-01-05 (×5): qty 1

## 2021-01-05 MED ORDER — DEXAMETHASONE SODIUM PHOSPHATE 10 MG/ML IJ SOLN
INTRAMUSCULAR | Status: AC
  Filled 2021-01-05: qty 1

## 2021-01-05 MED ORDER — PHENOL 1.4 % MT LIQD: 1.0000 | OROMUCOSAL | Status: DC | PRN

## 2021-01-05 SURGICAL SUPPLY — 40 items
BAG COUNTER SPONGE SURGICOUNT (BAG) ×4 IMPLANT
BAG ZIPLOCK 12X15 (MISCELLANEOUS) IMPLANT
BENZOIN TINCTURE PRP APPL 2/3 (GAUZE/BANDAGES/DRESSINGS) IMPLANT
BLADE SAW SGTL 18X1.27X75 (BLADE) ×2 IMPLANT
CELLS DAT CNTRL 66122 CELL SVR (MISCELLANEOUS) IMPLANT
CLEANER TIP ELECTROSURG 2X2 (MISCELLANEOUS) ×2 IMPLANT
CLOTH BEACON ORANGE TIMEOUT ST (SAFETY) ×2 IMPLANT
COVER PERINEAL POST (MISCELLANEOUS) ×2 IMPLANT
COVER SURGICAL LIGHT HANDLE (MISCELLANEOUS) ×2 IMPLANT
DRAPE STERI IOBAN 125X83 (DRAPES) ×2 IMPLANT
DRAPE U-SHAPE 47X51 STRL (DRAPES) ×4 IMPLANT
DRSG AQUACEL AG ADV 3.5X10 (GAUZE/BANDAGES/DRESSINGS) ×2 IMPLANT
DURAPREP 26ML APPLICATOR (WOUND CARE) ×2 IMPLANT
ELECT REM PT RETURN 15FT ADLT (MISCELLANEOUS) ×2 IMPLANT
GAUZE XEROFORM 1X8 LF (GAUZE/BANDAGES/DRESSINGS) ×2 IMPLANT
GLOVE SURG ENC MOIS LTX SZ7.5 (GLOVE) ×4 IMPLANT
GLOVE SURG NEOPR MICRO LF SZ8 (GLOVE) ×2 IMPLANT
GLOVE SURG UNDER LTX SZ8 (GLOVE) ×4 IMPLANT
GOWN STRL REUS W/TWL LRG LVL3 (GOWN DISPOSABLE) ×4 IMPLANT
HANDPIECE INTERPULSE COAX TIP (DISPOSABLE) ×1
HEAD FEM STD 32X+9 STRL (Hips) ×2 IMPLANT
HOLDER FOLEY CATH W/STRAP (MISCELLANEOUS) ×2 IMPLANT
KIT TURNOVER KIT A (KITS) ×2 IMPLANT
PACK ANTERIOR HIP CUSTOM (KITS) ×2 IMPLANT
PENCIL SMOKE EVACUATOR (MISCELLANEOUS) IMPLANT
RTRCTR WOUND ALEXIS 18CM MED (MISCELLANEOUS)
SET HNDPC FAN SPRY TIP SCT (DISPOSABLE) ×1 IMPLANT
STAPLER VISISTAT 35W (STAPLE) ×2 IMPLANT
STRIP CLOSURE SKIN 1/2X4 (GAUZE/BANDAGES/DRESSINGS) IMPLANT
SUT ETHIBOND NAB CT1 #1 30IN (SUTURE) ×2 IMPLANT
SUT ETHILON 3 0 PS 1 (SUTURE) ×2 IMPLANT
SUT MNCRL AB 4-0 PS2 18 (SUTURE) ×2 IMPLANT
SUT VIC AB 0 CT1 36 (SUTURE) ×2 IMPLANT
SUT VIC AB 1 CT1 36 (SUTURE) ×6 IMPLANT
SUT VIC AB 2-0 CT1 27 (SUTURE) ×2
SUT VIC AB 2-0 CT1 TAPERPNT 27 (SUTURE) ×2 IMPLANT
TRAY FOLEY MTR SLVR 16FR STAT (SET/KITS/TRAYS/PACK) ×2 IMPLANT
TUBING CONNECTING 10 (TUBING) ×2 IMPLANT
WATER STERILE IRR 1000ML POUR (IV SOLUTION) ×2 IMPLANT
YANKAUER SUCT BULB TIP NO VENT (SUCTIONS) ×4 IMPLANT

## 2021-01-05 NOTE — Discharge Instructions (Signed)

## 2021-01-05 NOTE — Anesthesia Postprocedure Evaluation (Signed)
Anesthesia Post Note  Patient: Garment/textile technologist  Procedure(s) Performed: RIGHT HIP BALL (Right: Hip)     Patient location during evaluation: PACU Anesthesia Type: Spinal Level of consciousness: oriented and awake and alert Pain management: pain level controlled Vital Signs Assessment: post-procedure vital signs reviewed and stable Respiratory status: spontaneous breathing, respiratory function stable and patient connected to nasal cannula oxygen Cardiovascular status: blood pressure returned to baseline and stable Postop Assessment: no headache, no backache and no apparent nausea or vomiting Anesthetic complications: no   No notable events documented.  Last Vitals:  Vitals:   01/05/21 1547 01/05/21 1600  BP: 96/73 108/72  Pulse: 88 84  Resp: 20 17  Temp: 36.9 C   SpO2: 100% 100%    Last Pain:  Vitals:   01/05/21 1600  PainSc: 5                  Lisa [name] S

## 2021-01-05 NOTE — Brief Op Note (Signed)
01/05/2021  3:35 PM  PATIENT:  Lisa [name]  50 y.o. female  PRE-OPERATIVE DIAGNOSIS:  leg length discrepancy s/p right total hip arthroplasty  POST-OPERATIVE DIAGNOSIS:  leg length discrepancy s/p right total hip arthroplasty  PROCEDURE:  Procedure(s): RIGHT HIP BALL/POLY-LINER EXCHANGE (Right)  SURGEON:  Surgeon(s) and Role:    Kathryne Hitch, MD - Primary  PHYSICIAN ASSISTANT: Rexene Edison, PA-C  ANESTHESIA:   spinal  EBL:  275 mL   COUNTS:  YES  DICTATION: .Other Dictation: Dictation Number [id number]  PLAN OF CARE: Admit for overnight observation  PATIENT DISPOSITION:  PACU - hemodynamically stable.   Delay start of Pharmacological VTE agent (>24hrs) due to surgical blood loss or risk of bleeding: no

## 2021-01-05 NOTE — Anesthesia Procedure Notes (Signed)
Procedure Name: MAC Date/Time: 01/05/2021 12:25 PM Performed by: Deliah Boston, CRNA Pre-anesthesia Checklist: Patient identified, Emergency Drugs available, Suction available and Patient being monitored Patient Re-evaluated:Patient Re-evaluated prior to induction Oxygen Delivery Method: Simple face mask Placement Confirmation: positive ETCO2

## 2021-01-05 NOTE — Interval H&P Note (Signed)
History and Physical Interval Note: The patient understands she is here today for [place] to hopefully remove scar tissue from her previous right total hip arthroplasty and increase her leg lengths with trying to remove the previous hip ball and golf a few sizes.  The goal of this is to hopefully improve her balance and her gait which will take pressure off her back due to chronic back issues from her leg length difference which is significant with the right side shorter than left.  There has been no acute or interval change in her medical status.  Please see recent H&P.  The risks and benefits have been discussed in detail and informed consent is obtained.  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  The various methods of treatment have been discussed with the patient and family. After consideration of risks, benefits and other options for treatment, the patient has consented to  Procedure(s): RIGHT HIP BALL/POLY-LINER EXCHANGE (Right) as a surgical intervention.  The patient's history has been reviewed, patient examined, no change in status, stable for surgery.  I have reviewed the patient's chart and labs.  Questions were answered to the patient's satisfaction.     Kathryne Hitch

## 2021-01-05 NOTE — Anesthesia Procedure Notes (Signed)
Spinal  Patient location during procedure: OR Start time: 01/05/2021 12:28 PM End time: 01/05/2021 12:33 PM Reason for block: surgical anesthesia Staffing Performed: anesthesiologist  Anesthesiologist: Eilene Ghazi, MD Preanesthetic Checklist Completed: patient identified, IV checked, site marked, risks and benefits discussed, surgical consent, monitors and equipment checked, pre-op evaluation and timeout performed Spinal Block Patient position: sitting Prep: Betadine Patient monitoring: heart rate, continuous pulse ox and blood pressure Approach: midline Location: L3-4 Injection technique: single-shot Needle Needle type: Sprotte  Needle gauge: 24 G Needle length: 9 cm Assessment Sensory level: T6 Events: CSF return Additional Notes

## 2021-01-05 NOTE — Anesthesia Preprocedure Evaluation (Signed)
Anesthesia Evaluation  Patient identified by MRN, date of birth, ID band Patient awake    Reviewed: Allergy & Precautions, NPO status , Patient's Chart, lab work & pertinent test results  Airway Mallampati: II  TM Distance: >3 FB Neck ROM: Full    Dental no notable dental hx.    Pulmonary asthma , former smoker,    Pulmonary exam normal breath sounds clear to auscultation       Cardiovascular hypertension, Pt. on medications Normal cardiovascular exam Rhythm:Regular Rate:Normal     Neuro/Psych negative neurological ROS  negative psych ROS   GI/Hepatic negative GI ROS, Neg liver ROS,   Endo/Other  diabetes  Renal/GU negative Renal ROS  negative genitourinary   Musculoskeletal negative musculoskeletal ROS (+)   Abdominal   Peds negative pediatric ROS (+)  Hematology negative hematology ROS (+)   Anesthesia Other Findings   Reproductive/Obstetrics negative OB ROS                             Anesthesia Physical Anesthesia Plan  ASA: 2  Anesthesia Plan: Spinal   Post-op Pain Management:    Induction: Intravenous  PONV Risk Score and Plan: 2 and Ondansetron, Dexamethasone, Propofol infusion and Treatment may vary due to age or medical condition  Airway Management Planned: Simple Face Mask  Additional Equipment:   Intra-op Plan:   Post-operative Plan:   Informed Consent: I have reviewed the patients History and Physical, chart, labs and discussed the procedure including the risks, benefits and alternatives for the proposed anesthesia with the patient or authorized representative who has indicated his/her understanding and acceptance.     Dental advisory given  Plan Discussed with: CRNA and Surgeon  Anesthesia Plan Comments:         Anesthesia Quick Evaluation

## 2021-01-05 NOTE — Plan of Care (Signed)
  Problem: Activity: Goal: Risk for activity intolerance will decrease Outcome: Progressing   Problem: Pain Managment: Goal: General experience of comfort will improve Outcome: Progressing   

## 2021-01-05 NOTE — Transfer of Care (Signed)
Immediate Anesthesia Transfer of Care Note  Patient: Cordelia Poche  Procedure(s) Performed: RIGHT HIP BALL (Right: Hip)  Patient Location: PACU  Anesthesia Type:MAC and Spinal  Level of Consciousness: awake, drowsy and patient cooperative  Airway & Oxygen Therapy: Patient Spontanous Breathing and Patient connected to face mask oxygen  Post-op Assessment: Report given to RN and Post -op Vital signs reviewed and stable  Post vital signs: Reviewed and stable  Last Vitals:  Vitals Value Taken Time  BP 96/73 01/05/21 1547  Temp    Pulse 85 01/05/21 1549  Resp 17 01/05/21 1549  SpO2 100 % 01/05/21 1549  Vitals shown include unvalidated device data.  Last Pain:  Vitals:   01/05/21 1016  PainSc: 0-No pain         Complications: No notable events documented.

## 2021-01-06 LAB — CBC
HCT: 29.9 % — ABNORMAL LOW (ref 36.0–46.0)
Hemoglobin: 9.9 g/dL — ABNORMAL LOW (ref 12.0–15.0)
MCH: 30.2 pg (ref 26.0–34.0)
MCHC: 33.1 g/dL (ref 30.0–36.0)
MCV: 91.2 fL (ref 80.0–100.0)
Platelets: 272 10*3/uL (ref 150–400)
RBC: 3.28 MIL/uL — ABNORMAL LOW (ref 3.87–5.11)
RDW: 13.6 % (ref 11.5–15.5)
WBC: 14.5 10*3/uL — ABNORMAL HIGH (ref 4.0–10.5)
nRBC: 0 % (ref 0.0–0.2)

## 2021-01-06 LAB — BASIC METABOLIC PANEL
Anion gap: 10 (ref 5–15)
BUN: 18 mg/dL (ref 6–20)
CO2: 24 mmol/L (ref 22–32)
Calcium: 8.6 mg/dL — ABNORMAL LOW (ref 8.9–10.3)
Chloride: 103 mmol/L (ref 98–111)
Creatinine, Ser: 1.02 mg/dL — ABNORMAL HIGH (ref 0.44–1.00)
GFR, Estimated: >60 mL/min (ref >60)
Glucose, Bld: 183 mg/dL — ABNORMAL HIGH (ref 70–99)
Potassium: 4.3 mmol/L (ref 3.5–5.1)
Sodium: 137 mmol/L (ref 135–145)

## 2021-01-06 MED ORDER — ASPIRIN 81 MG PO CHEW: 81.0000 mg | CHEWABLE_TABLET | Freq: Two times a day (BID) | ORAL | 0 refills | Status: DC

## 2021-01-06 MED ORDER — METHOCARBAMOL 500 MG PO TABS: 500.0000 mg | ORAL_TABLET | Freq: Four times a day (QID) | ORAL | 0 refills | Status: AC | PRN

## 2021-01-06 MED ORDER — OXYCODONE HCL 5 MG PO TABS: 5.0000 mg | ORAL_TABLET | Freq: Four times a day (QID) | ORAL | 0 refills | Status: DC | PRN

## 2021-01-06 NOTE — Evaluation (Signed)
Physical Therapy Evaluation Patient Details Name: Lisa [name] MRN: [id number] DOB: [birth year] Today's Date: 01/06/2021   History of Present Illness  Pt is 50 y.o. female s/p hip ball exchange of right hip on 01/05/21. PMH significant for HTN, asthma, DM, [name] (2015).  Clinical Impression  Pt is POD #1 s/p hip ball exchange of right hip resulting in the deficits listed below (see PT Problem List). Pt performed sit to stand transfers with MIN assist for safety and cues for safe hand placement. Pt ambulated total of ~72ft with MIN A progressing to MIN guard progressing for safety. Cues provided for step to gait patten with no LOB observed. PT reviewed LE intervention for promotion of DVT prevention, pt demonstrated understanding. Pt will have assist from her daughter upon d/c. Pt will benefit from skilled PT to maximize functional mobility to increase independence and for stair training to ensure safety upon d/c.      Follow Up Recommendations Home health PT;Follow surgeon's recommendation for DC plan and follow-up therapies    Equipment Recommendations  Rolling walker with 5" wheels    Recommendations for Other Services       Precautions / Restrictions Precautions Precautions: Fall Restrictions Weight Bearing Restrictions: No RLE Weight Bearing: Weight bearing as tolerated      Mobility  Bed Mobility Overal bed mobility: Needs Assistance Bed Mobility: Supine to Sit;Sit to Supine     Supine to sit: Supervision;HOB elevated Sit to supine: Min assist;HOB elevated   General bed mobility comments: HOB slightly elevated; PT educated pt on use of gait belt loop to assist with progressing Rt LE to EOB to maximize independence when experiencing increased pain levels. Pt with use of gait belt around Rt foot for progression of Rt LE onto bed requiring MIN A from therapist to assist with completing progression of B LEs onto bed.    Transfers Overall transfer level: Needs  assistance Equipment used: Rolling walker (2 wheeled) Transfers: Sit to/from Stand Sit to Stand: Min assist;From elevated surface         General transfer comment: Assisted pt with donning shoes while EOB. Pt has been wearing shoes with a heel lift due to leg length discrepancy, shoes without lift today. MIN A from elevated bed surface to simulate bed height at home. MIN A for power up and stability in standing with cues for safe hand placement.  Ambulation/Gait Ambulation/Gait assistance: Min guard;Min assist Gait Distance (Feet): 70 Feet Assistive device: Rolling walker (2 wheeled) Gait Pattern/deviations: Step-to pattern;Decreased stride length;Decreased weight shift to right Gait velocity: decr   General Gait Details: Pt ambulated ~64ft with use of RW and MIN A progressing to MIN guard with cues for step to gait pattern, no LOB observed. Pt states that her Rt leg still feels slightly shorter compared to her Lt when ambulating but "it is much better." Pt reports that she has used cructhes in the past but has been unsteady and experienced falls when using them and feels much more stable/comfortable with use of RW.  Stairs            Wheelchair Mobility    Modified Rankin (Stroke Patients Only)       Balance Overall balance assessment: Needs assistance Sitting-balance support: Feet supported Sitting balance-Leahy Scale: Good     Standing balance support: Bilateral upper extremity supported;During functional activity Standing balance-Leahy Scale: Poor Standing balance comment: use of RW for balance  Pertinent Vitals/Pain Pain Assessment: 0-10 Pain Score: 5  Pain Location: Rt hip Pain Descriptors / Indicators: Sore Pain Intervention(s): Limited activity within patient's tolerance;Monitored during session;Repositioned;Ice applied    Home Living Family/patient expects to be discharged to:: Private residence Living Arrangements:  Parent;Children Available Help at Discharge: Family Type of Home: House Home Access: Stairs to enter Entrance Stairs-Rails: None Entrance Stairs-Number of Steps: 1 (front entrance has 3 STE, no handrail) Home Layout: Two level;1/2 bath on main level Home Equipment: Walker - 4 wheels;Crutches;Cane - single point;Shower seat Additional Comments: Pt's daughter will be staying for a few weeks to help during recovery. Pt's bedroom is on 2nd level    Prior Function Level of Independence: Independent         Comments: Independent without assistive device for housheold ambulation. uses rollator for long distances     Hand Dominance   Dominant Hand: Right    Extremity/Trunk Assessment   Upper Extremity Assessment Upper Extremity Assessment: Overall WFL for tasks assessed    Lower Extremity Assessment Lower Extremity Assessment: RLE deficits/detail RLE Deficits / Details: Pt with fair quad set strength and 4+/5 DF/PF strength. RLE Coordination: WNL    Cervical / Trunk Assessment Cervical / Trunk Assessment: Normal  Communication   Communication: No difficulties  Cognition Arousal/Alertness: Awake/alert Behavior During Therapy: WFL for tasks assessed/performed Overall Cognitive Status: Within Functional Limits for tasks assessed                                        General Comments      Exercises Total Joint Exercises Ankle Circles/Pumps: AROM;Both;10 reps;Supine   Assessment/Plan    PT Assessment Patient needs continued PT services  PT Problem List Decreased strength;Decreased range of motion;Decreased activity tolerance;Decreased balance;Decreased mobility;Decreased knowledge of use of DME;Pain       PT Treatment Interventions DME instruction;Gait training;Stair training;Functional mobility training;Therapeutic activities;Therapeutic exercise;Balance training;Patient/family education    PT Goals (Current goals can be found in the Care Plan  section)  Acute Rehab PT Goals Patient Stated Goal: Be more independent with moving PT Goal Formulation: With patient Time For Goal Achievement: 01/20/21 Potential to Achieve Goals: Good    Frequency 7X/week   Barriers to discharge        Co-evaluation               AM-PAC PT "6 Clicks" Mobility  Outcome Measure Help needed turning from your back to your side while in a flat bed without using bedrails?: A Little Help needed moving from lying on your back to sitting on the side of a flat bed without using bedrails?: A Little Help needed moving to and from a bed to a chair (including a wheelchair)?: A Little Help needed standing up from a chair using your arms (e.g., wheelchair or bedside chair)?: A Little Help needed to walk in hospital room?: A Little Help needed climbing 3-5 steps with a railing? : A Lot 6 Click Score: 17    End of Session Equipment Utilized During Treatment: Gait belt Activity Tolerance: Patient tolerated treatment well Patient left: in bed;with call bell/phone within reach;with bed alarm set Nurse Communication: Mobility status PT Visit Diagnosis: Muscle weakness (generalized) (M62.81);Pain;Other abnormalities of gait and mobility (R26.89) Pain - Right/Left: Right Pain - part of body: Hip    Time: 3976-7341 PT Time Calculation (min) (ACUTE ONLY): 33 min   Charges:   PT  Evaluation $PT Eval Low Complexity: 1 Low PT Treatments $Therapeutic Activity: 8-22 mins       Lyman Speller PT, DPT  Acute Rehabilitation Services  Office [phone]   01/06/2021, 1:54 PM

## 2021-01-06 NOTE — Op Note (Signed)
NAME: Lisa [name], OHANIAN MEDICAL RECORD NO: [id number] ACCOUNT NO: [id number] DATE OF BIRTH: [birth year] FACILITY: [facility] LOCATION: [id number] PHYSICIAN: Vanita Panda. Magnus Ivan, MD  Operative Report   DATE OF PROCEDURE: 01/05/2021  PREOPERATIVE DIAGNOSIS:  Significant leg length discrepancy with right leg shorter than left, status post remote total hip arthroplasty.  POSTOPERATIVE DIAGNOSIS:  Significant leg length discrepancy with right leg shorter than left, status post remote total hip arthroplasty.  PROCEDURE: Hip ball exchange of right hip.  EXPLANT: 32+1 ceramic hip ball.  IMPLANT:  32+1 metal hip ball.  SURGEON:  Vanita Panda. Magnus Ivan, MD  ASSISTANT:  Richardean Canal, PA-C  ANESTHESIA:  Spinal.  ANTIBIOTICS: 900 mg IV clindamycin.  BLOOD LOSS:  300 mL  COMPLICATIONS:  None.  INDICATIONS:  The patient is a 50 year old female well known to me. 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  Unfortunately, she had severe pain prior to  surgery and had to hold off on surgery for many months while pulmonary issues are being taken care of.  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  Her right anterior hip  replacement surgery was very difficult and we were able to replace her hip, but she has significant leg length discrepancy because of the soft tissues being so contracted.  I have not seen her in many years now and she came to the office recently with  significant back pain.  She wear shoe buildups on all of her shoes.  She has a significant leg length discrepancy.  Her hip pain has been present, but she no longer has flexion contractures of her hip and her knee.  At this point, we have recommended  revision arthroplasty with hopefully just a changing out the hip ball to a much longer ball that can increase her leg  length and thus help her posture, her back pain and decreased her hip pain as well.  We had a long and thorough discussion about this  surgery and the risks and benefits were explained in detail.  She does wish to proceed.  DESCRIPTION OF PROCEDURE:  After informed consent was obtained, appropriate right hip was marked.  She was brought to the operating room and sat up on a stretcher.  Spinal anesthesia was obtained.  She was laid in supine position on the stretcher.  Foley  catheter was placed.  I was able to assess her leg length and she is significantly short on the right side. I note the case would be difficult due to that, but not instrumental.  She still does have a slight flexion contracture of the knee.  She was  next placed supine on the Hana fracture table. The perineal post in place and both legs in line skeletal traction devices.  No traction applied.  Her right operative hip was prepped and draped in DuraPrep and sterile drapes.  A timeout was called and she  was identified as correct patient, correct right hip.  We then made an incision over her previous incision on the anterolateral thigh and took this down to the tensor fascia lata muscle.  Tensor fascia was divided in all the scar tissue, so we could get  down to her hip through in anterior approach.  We had to go through abundant scar tissue to get to her hip and finally we were able to get  to the hip, removing all the anterior capsule.  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  It took quite some time releasing all the soft tissue and removing scar tissue from around all this and removing some of the greater trochanteric heterotopic bone that had developed.  Once we were able to do so, we dislocated the  hip and removed the ceramic 32+1 ball.  This was quite difficult.  We saw no wear in the acetabular components, so we did not change that out, but I think it would have been difficult to  change it out with the much of abundant scar tissue that she had.   We then went up to a 32+1 metal hip ball and was very difficult, we were able to get it reduced into the pelvis.  We then assessed the stability of the hip radiographically and mechanically and it was stable and we had increased her leg lengths to I  believe almost equal at this point. Again, she was significantly short before.  We then irrigated the soft tissue with normal saline solution using pulsatile lavage.  We closed any deep tissue we could with #1 Ethibond suture, followed by #1 Vicryl to  close the tensor fascial layer, 0 Vicryl was used to close the deep tissue and 2-0 Vicryl to close the subcutaneous tissue.  The skin was reapproximated with staples.  An Aquacel dressing was applied.  She was taken off the Hana table and taken to  recovery in stable condition with all final counts being correct and no complications noted.  Of note, Richardean Canal, PA-C did assist during the entire case.  His assistance was crucial for facilitating all aspects of this case.   VAI D: 01/05/2021 3:30:41 pm T: 01/06/2021 2:00:00 am  JOB: [id number]/ [id number]

## 2021-01-06 NOTE — Progress Notes (Signed)
Subjective: 1 Day Post-Op Procedure(s) (LRB): RIGHT HIP BALL (Right) Patient reports pain as moderate.    Objective: Vital signs in last 24 hours: Temp:  [97.8 F (36.6 C)-98.5 F (36.9 C)] 98.1 F (36.7 C) (09/10 0322) Pulse Rate:  [79-106] 96 (09/10 0322) Resp:  [15-24] 20 (09/10 0340) BP: (96-138)/(64-86) 119/64 (09/10 0322) SpO2:  [94 %-100 %] 95 % (09/10 0814)  Intake/Output from previous day: 09/09 0701 - 09/10 0700 In: 3053.1 [P.O.:280; I.V.:2623.1; IV Piggyback:150] Out: 1800 [Urine:1525; Blood:275] Intake/Output this shift: Total I/O In: -  Out: 400 [Urine:400]  Recent Labs    01/05/21 1030 01/06/21 0313  HGB 13.1 9.9*   Recent Labs    01/05/21 1030 01/06/21 0313  WBC 10.2 14.5*  RBC 4.46 3.28*  HCT 40.0 29.9*  PLT 323 272   Recent Labs    01/05/21 1030 01/06/21 0313  NA 138 137  K 3.8 4.3  CL 100 103  CO2 28 24  BUN 12 18  CREATININE 0.81 1.02*  GLUCOSE 131* 183*  CALCIUM 9.9 8.6*   No results for input(s): LABPT, INR in the last 72 hours.  Sensation intact distally Intact pulses distally Dorsiflexion/Plantar flexion intact Incision: scant drainage   Assessment/Plan: 1 Day Post-Op Procedure(s) (LRB): RIGHT HIP BALL (Right) Up with therapy Plan for discharge tomorrow Discharge home with home health      Kathryne Hitch 01/06/2021, 10:18 AM

## 2021-01-06 NOTE — Plan of Care (Signed)
  Problem: Activity: Goal: Risk for activity intolerance will decrease 01/06/2021 0737 by Penelope Galas, RN Outcome: Progressing 01/05/2021 1816 by Penelope Galas, RN Outcome: Progressing   Problem: Pain Managment: Goal: General experience of comfort will improve 01/06/2021 0737 by Penelope Galas, RN Outcome: Progressing 01/05/2021 1816 by Penelope Galas, RN Outcome: Progressing   Problem: Safety: Goal: Ability to remain free from injury will improve 01/06/2021 0737 by Penelope Galas, RN Outcome: Progressing 01/05/2021 1816 by Penelope Galas, RN Outcome: Progressing

## 2021-01-07 LAB — CBC
HCT: 27.9 % — ABNORMAL LOW (ref 36.0–46.0)
Hemoglobin: 9.3 g/dL — ABNORMAL LOW (ref 12.0–15.0)
MCH: 29.8 pg (ref 26.0–34.0)
MCHC: 33.3 g/dL (ref 30.0–36.0)
MCV: 89.4 fL (ref 80.0–100.0)
Platelets: 231 10*3/uL (ref 150–400)
RBC: 3.12 MIL/uL — ABNORMAL LOW (ref 3.87–5.11)
RDW: 13.9 % (ref 11.5–15.5)
WBC: 13.9 10*3/uL — ABNORMAL HIGH (ref 4.0–10.5)
nRBC: 0 % (ref 0.0–0.2)

## 2021-01-07 NOTE — Progress Notes (Signed)
   Subjective:  No acute events overnight.  Resting comfortably this AM.  Pain well controlled.   Objective:   VITALS:   Vitals:   01/06/21 1254 01/06/21 2039 01/07/21 0646 01/07/21 0828  BP: 136/74 (!) 143/81 123/76   Pulse: (!) 103 (!) 106 98 (!) 105  Resp: 18 16 18 18   Temp: 99.1 F (37.3 C) 98 F (36.7 C) 98 F (36.7 C)   TempSrc: Oral Oral Oral   SpO2: 94% 99% 90% 93%  Weight:      Height:        Gen: Resting comfortably Pulm: nl WOB on RA CV: Normal rate, BLE warm and well perfused RLE: Dressing mildly saturated, EHL/FHL/ADF/APF 5/5, SILT throughout foot    Lab Results  Component Value Date   WBC 13.9 (H) 01/07/2021   HGB 9.3 (L) 01/07/2021   HCT 27.9 (L) 01/07/2021   MCV 89.4 01/07/2021   PLT 231 01/07/2021     Assessment/Plan:  2 Days Post-Op s/p [name].  Did well w/ PT yesterday Will likely clear today Will put DC order for after pt clears therapy Will send Rx to [facility] in [place] F/U w/ Dr. [place] as scheduled  [facility] 01/07/2021, 10:54 AM [id number]

## 2021-01-07 NOTE — Plan of Care (Signed)
Patient discharged.

## 2021-01-07 NOTE — Discharge Summary (Signed)
Patient ID: Lisa [name] MRN: [id number] DOB/AGE: 11-12-1970 50 y.o.  Admit date: 01/05/2021 Discharge date: 01/07/2021  Admission Diagnoses:  Principal Problem:   Leg length discrepancy Active Problems:   Status post revision of total hip   Discharge Diagnoses:  Same  Past Medical History:  Diagnosis Date   Arthritis    hips, back   Asthma    Diabetes mellitus without complication (HCC)    H/O: pneumothorax 04/30/2011   Hypertension     Surgeries: Procedure(s): RIGHT HIP BALL on 01/05/2021   Consultants:   Discharged Condition: Improved  Hospital Course: Lisa [name] is an 50 y.o. female who was admitted 01/05/2021 for operative treatment ofLeg length discrepancy. Patient has severe unremitting pain that affects sleep, daily activities, and work/hobbies. After pre-op clearance the patient was taken to the operating room on 01/05/2021 and underwent  Procedure(s): RIGHT HIP BALL.    Patient was given perioperative antibiotics:  Anti-infectives (From admission, onward)    Start     Dose/Rate Route Frequency Ordered Stop   01/05/21 1830  clindamycin (CLEOCIN) IVPB 600 mg        600 mg 100 mL/hr over 30 Minutes Intravenous Every 6 hours 01/05/21 1741 01/06/21 0146   01/05/21 1015  clindamycin (CLEOCIN) IVPB 900 mg        900 mg 100 mL/hr over 30 Minutes Intravenous On call to O.R. 01/05/21 1000 01/05/21 1234   01/05/21 1011  clindamycin (CLEOCIN) 900 MG/50ML IVPB       Note to Pharmacy: Montel Clock   : cabinet override      01/05/21 1011 01/05/21 1240        Patient was given sequential compression devices, early ambulation, and chemoprophylaxis to prevent DVT.  Patient benefited maximally from hospital stay and there were no complications.    Recent vital signs: Patient Vitals for the past 24 hrs:  BP Temp Temp src Pulse Resp SpO2  01/07/21 1412 -- -- -- -- -- 98 %  01/07/21 1348 130/67 99 F (37.2 C) Oral (!) 109 18 99 %  01/07/21 0828 -- -- -- (!)  105 18 93 %  01/07/21 0646 123/76 98 F (36.7 C) Oral 98 18 90 %  01/06/21 2039 (!) 143/81 98 F (36.7 C) Oral (!) 106 16 99 %     Recent laboratory studies:  Recent Labs    01/05/21 1030 01/06/21 0313 01/07/21 0311  WBC 10.2 14.5* 13.9*  HGB 13.1 9.9* 9.3*  HCT 40.0 29.9* 27.9*  PLT 323 272 231  NA 138 137  --   K 3.8 4.3  --   CL 100 103  --   CO2 28 24  --   BUN 12 18  --   CREATININE 0.81 1.02*  --   GLUCOSE 131* 183*  --   CALCIUM 9.9 8.6*  --      Discharge Medications:   Allergies as of 01/07/2021       Reactions   Penicillins Swelling, Rash        Medication List     STOP taking these medications    aspirin EC 81 MG tablet Replaced by: aspirin 81 MG chewable tablet       TAKE these medications    albuterol 108 (90 Base) MCG/ACT inhaler Commonly known as: VENTOLIN HFA Inhale 2 puffs into the lungs every 6 (six) hours as needed for wheezing or shortness of breath.   amLODipine 5 MG tablet Commonly known as: NORVASC Take 5 mg  by mouth every morning.   aspirin 81 MG chewable tablet Chew 1 tablet (81 mg total) by mouth 2 (two) times daily. Replaces: aspirin EC 81 MG tablet   cholecalciferol 25 MCG (1000 UNIT) tablet Commonly known as: VITAMIN D3 Take 1,000 Units by mouth daily.   cycloSPORINE 0.05 % ophthalmic emulsion Commonly known as: RESTASIS Place 1 drop into both eyes 2 (two) times daily.   diclofenac Sodium 1 % Gel Commonly known as: VOLTAREN Apply 1 application topically daily as needed (pain).   Eysuvis 0.25 % Susp Generic drug: Loteprednol Etabonate Place 1 drop into both eyes daily.   ipratropium-albuterol 0.5-2.5 (3) MG/3ML Soln Commonly known as: DUONEB Take 3 mLs by nebulization every 6 (six) hours as needed (Asthma).   losartan-hydrochlorothiazide 100-25 MG tablet Commonly known as: HYZAAR Take 1 tablet by mouth every morning.   metFORMIN 500 MG 24 hr tablet Commonly known as: GLUCOPHAGE-XR Take 500 mg by mouth  in the morning and at bedtime.   methocarbamol 500 MG tablet Commonly known as: ROBAXIN Take 1 tablet (500 mg total) by mouth every 6 (six) hours as needed for muscle spasms.   montelukast 10 MG tablet Commonly known as: SINGULAIR Take 10 mg by mouth at bedtime.   multivitamin with minerals tablet Take 1 tablet by mouth daily.   oxyCODONE 5 MG immediate release tablet Commonly known as: Oxy IR/ROXICODONE Take 1-2 tablets (5-10 mg total) by mouth every 6 (six) hours as needed for moderate pain (pain score 4-6).   tiZANidine 4 MG tablet Commonly known as: ZANAFLEX Take 4 mg by mouth 2 (two) times daily as needed for muscle spasms.   Trelegy Ellipta 200-62.5-25 MCG/INH Aepb Generic drug: Fluticasone-Umeclidin-Vilant Inhale 1 puff into the lungs daily.   vitamin C 1000 MG tablet Take 1,000 mg by mouth daily.               Durable Medical Equipment  (From admission, onward)           Start     Ordered   01/07/21 0000  DME Bedside commode       Question:  Patient needs a bedside commode to treat with the following condition  Answer:  Status post hip replacement   01/07/21 1100   01/05/21 1742  DME 3 n 1  Once        01/05/21 1741   01/05/21 1742  DME Walker rolling  Once       Question Answer Comment  Walker: With 5 Inch Wheels   Patient needs a walker to treat with the following condition S/P revision of total hip      01/05/21 1741            Diagnostic Studies: DG Pelvis Portable  Result Date: 01/05/2021 CLINICAL DATA:  Hip revision EXAM: PORTABLE PELVIS 1-2 VIEWS COMPARISON:  01/05/2021, 11/14/2020 FINDINGS: Right hip arthroplasty post revision. Gas in the soft tissues consistent with recent surgery. Normal alignment. No fracture. Clips in the right pelvis IMPRESSION: Right hip arthroplasty with postoperative changes Electronically Signed   By: Jasmine Pang M.D.   On: 01/05/2021 19:43   DG C-Arm 1-60 Min-No Report  Result Date: 01/05/2021 Fluoroscopy  was utilized by the requesting physician.  No radiographic interpretation.   DG HIP OPERATIVE UNILAT W OR W/O PELVIS RIGHT  Result Date: 01/05/2021 CLINICAL DATA:  RIGHT hip total hip replacement in a 50 year old female. EXAM: OPERATIVE RIGHT HIP (WITH PELVIS IF PERFORMED) to VIEWS TECHNIQUE: Fluoroscopic spot image(s) were  submitted for interpretation post-operatively. COMPARISON:  November 14, 2020 Fluoroscopy dose: FLUOROSCOPY TIME:  6 seconds FINDINGS: Limited intraoperative spot views of the RIGHT hip show changes of RIGHT total hip arthroplasty reportedly for exchange of ball/poly liner. Gas is present in the soft tissues about the hip. On AP projection no immediate complications are demonstrated. Tubal ligation clips project over the RIGHT hemipelvis as seen on preoperative assessment. IMPRESSION: RIGHT hip arthroplasty status post RIGHT hip ball/poly liner exchange without immediate complicating features. Electronically Signed   By: Donzetta Kohut M.D.   On: 01/05/2021 15:54    Disposition: Discharge disposition: 01-Home or Self Care       Discharge Instructions     DME Bedside commode   Complete by: As directed    Patient needs a bedside commode to treat with the following condition: Status post hip replacement   Discharge patient   Complete by: As directed    Discharge once clears PT today   Discharge disposition: 01-Home or Self Care   Discharge patient date: 01/07/2021        Follow-up Information     Kathryne Hitch, MD Follow up in 2 week(s).   Specialty: Orthopedic Surgery Contact information: [address] [place] [address] [phone]                  Signed: Kathryne Hitch 01/07/2021, 4:21 PM

## 2021-01-07 NOTE — TOC Transition Note (Signed)
Transition of Care [facility]) - CM/SW Discharge Note   Patient Details  Name: Lisa [name] MRN: [id number] Date of Birth: 1971-03-24  [facility] of [facility]) CM/SW Contact:  Larrie Kass, LCSW Phone Number: 01/07/2021, 11:21 AM   Clinical Narrative:    CSW spoke with [facility] with [employer] to confirm order for Walker and bedside Commode.      Barriers to Discharge: No Barriers Identified   Patient Goals and CMS Choice        Discharge Placement                       Discharge Plan and Services                                     Social Determinants of Health (SDOH) Interventions     Readmission Risk Interventions No flowsheet data found.

## 2021-01-07 NOTE — Progress Notes (Signed)
Physical Therapy Treatment Patient Details Name: Lisa [name] MRN: [id number] DOB: Sep 09, 1970 Today's Date: 01/07/2021    History of Present Illness Pt is 50 y.o. female s/p hip ball exchange of right hip on 01/05/21. PMH significant for HTN, asthma, DM, [name] (2015).    PT Comments    Pt is POD #2 s/p hip ball exchange of right hip resulting in the deficits listed below (see PT Problem List). Pt performed sit to stand transfers with MIN guard for safety and cues for safe hand placement. Pt ambulated total of ~80ft with x2 standing rest break and MIN A-MIN guard  for safety. Cues provided for step to gait patten with no LOB observed. Pt performed safe stair negotiation with MIN A for RW stability and cues for sequencing and safe hand placement. PT reviewed LE HEP for promotion of DVT prevention and improved strength/ROM, pt demonstrated understanding. Pt will have assist from her daughter upon d/c. Pt is currently at safe mobility level for d/c and will benefit from skilled PT to maximize functional mobility to increase independence.     Follow Up Recommendations  Home health PT;Follow surgeon's recommendation for DC plan and follow-up therapies     Equipment Recommendations  Rolling walker with 5" wheels    Recommendations for Other Services       Precautions / Restrictions Precautions Precautions: Fall Restrictions Weight Bearing Restrictions: No RLE Weight Bearing: Weight bearing as tolerated    Mobility  Bed Mobility Overal bed mobility: Needs Assistance Bed Mobility: Supine to Sit;Sit to Supine     Supine to sit: Supervision;HOB elevated Sit to supine: Min assist;HOB elevated   General bed mobility comments: HOB slightly elevated; Pt able to perform supine to sit with supervision and initate Rt LE to EOB without use of external support from gait belt use. Pt with use of gait belt around Rt foot for progression of Rt LE onto bed requiring MIN A from therapist to assist  with completing progression of B LEs onto bed.    Transfers Overall transfer level: Needs assistance Equipment used: Rolling walker (2 wheeled) Transfers: Sit to/from Stand Sit to Stand: Min guard         General transfer comment: Assisted pt with donning socks/shoes while EOB. MIN guard for safety with cues for safe hand placement, pt required increased time to come to full standing position due to pain in Rt hip, grimacing and vocalizing out in pain when coming to upright posture.  Ambulation/Gait Ambulation/Gait assistance: Min guard;Min assist Gait Distance (Feet): 60 Feet Assistive device: Rolling walker (2 wheeled) Gait Pattern/deviations: Step-to pattern;Decreased stride length;Decreased weight shift to right Gait velocity: decr   General Gait Details: Pt ambulated ~100ft with use of RW and MIN A progressing to MIN guard with cues for step to gait pattern, no LOB observed. Pt required x2 standing rest breaks due to pain. Pt initially with increased difficulty progressing Rt LE in swing phase due to increased pain. Therapist provided cues for lateral weight shifting to Lt to assist with offlloading on Rt LE, improved progression of Rt LE following cues and with further ambulation distance.   Stairs Stairs: Yes Stairs assistance: Min assist Stair Management: No rails;Forwards;With walker Number of Stairs: 1 General stair comments: MIN A provided for RW stability with cues for sequencing "up with the good, down with the bad." Pt verbalized comfort with stair negotiation and will have assist from her daughter upon d/c.   Wheelchair Mobility    Modified Rankin (  Stroke Patients Only)       Balance Overall balance assessment: Needs assistance Sitting-balance support: Feet supported Sitting balance-Leahy Scale: Good     Standing balance support: Bilateral upper extremity supported;During functional activity Standing balance-Leahy Scale: Poor Standing balance comment: use  of RW for balance                            Cognition Arousal/Alertness: Awake/alert Behavior During Therapy: WFL for tasks assessed/performed Overall Cognitive Status: Within Functional Limits for tasks assessed                                        Exercises Total Joint Exercises Ankle Circles/Pumps: AROM;Both;10 reps;Supine Quad Sets: AROM;Right;5 reps;Supine Short Arc Quad: AROM;Right;5 reps;Supine Heel Slides: AROM;Right;5 reps;Supine Hip ABduction/ADduction: AROM;Right;5 reps;Supine Long Arc Quad: AROM;Right;5 reps;Supine    General Comments        Pertinent Vitals/Pain Pain Assessment: 0-10 Pain Score: 6  Pain Location: Rt hip Pain Descriptors / Indicators: Sore Pain Intervention(s): Limited activity within patient's tolerance;Monitored during session;Repositioned;Premedicated before session;Ice applied    Home Living Family/patient expects to be discharged to:: Private residence Living Arrangements: Parent;Children Available Help at Discharge: Family Type of Home: House Home Access: Stairs to enter Entrance Stairs-Rails: None Home Layout: Two level;1/2 bath on main level Home Equipment: Walker - 4 wheels;Crutches;Cane - single point;Shower seat Additional Comments: Pt's daughter will be staying for a few weeks to help during recovery. Pt's bedroom is on 2nd level    Prior Function Level of Independence: Independent      Comments: Independent without assistive device for housheold ambulation. uses rollator for long distances   PT Goals (current goals can now be found in the care plan section) Acute Rehab PT Goals Patient Stated Goal: Be more independent with moving PT Goal Formulation: With patient Time For Goal Achievement: 01/20/21 Potential to Achieve Goals: Good Progress towards PT goals: Progressing toward goals    Frequency    7X/week      PT Plan Current plan remains appropriate    Co-evaluation               AM-PAC PT "6 Clicks" Mobility   Outcome Measure  Help needed turning from your back to your side while in a flat bed without using bedrails?: A Little Help needed moving from lying on your back to sitting on the side of a flat bed without using bedrails?: A Little Help needed moving to and from a bed to a chair (including a wheelchair)?: A Little Help needed standing up from a chair using your arms (e.g., wheelchair or bedside chair)?: A Little Help needed to walk in hospital room?: A Little Help needed climbing 3-5 steps with a railing? : A Little 6 Click Score: 18    End of Session Equipment Utilized During Treatment: Gait belt Activity Tolerance: Patient tolerated treatment well Patient left: in bed;with call bell/phone within reach;with bed alarm set Nurse Communication: Mobility status PT Visit Diagnosis: Muscle weakness (generalized) (M62.81);Pain;Other abnormalities of gait and mobility (R26.89) Pain - Right/Left: Right Pain - part of body: Hip     Time: 1133-1202 PT Time Calculation (min) (ACUTE ONLY): 29 min  Charges:  $Gait Training: 8-22 mins $Therapeutic Exercise: 8-22 mins  Lyman Speller PT, DPT  Acute Rehabilitation Services  Office [phone]   01/07/2021, 12:25 PM

## 2021-01-09 ENCOUNTER — Encounter (HOSPITAL_COMMUNITY): Payer: Self-pay | Admitting: Orthopaedic Surgery

## 2021-01-10 ENCOUNTER — Telehealth: Payer: Self-pay | Admitting: Orthopaedic Surgery

## 2021-01-10 NOTE — Telephone Encounter (Signed)
Do you want her to do PT?

## 2021-01-10 NOTE — Telephone Encounter (Signed)
I called Lisa [name] and advised. She stated she wanted to wait and discuss this with dr. Magnus Ivan next week.

## 2021-01-10 NOTE — Telephone Encounter (Signed)
Patient called. She has not heard anything from the home PT place. Would like to know what the plan is. Her call back number is [phone]

## 2021-01-10 NOTE — Telephone Encounter (Signed)
Is she one of yours?

## 2021-01-12 ENCOUNTER — Other Ambulatory Visit: Payer: Self-pay | Admitting: Orthopaedic Surgery

## 2021-01-12 ENCOUNTER — Telehealth: Payer: Self-pay | Admitting: Orthopaedic Surgery

## 2021-01-12 MED ORDER — OXYCODONE HCL 5 MG PO TABS: 5.0000 mg | ORAL_TABLET | Freq: Four times a day (QID) | ORAL | 0 refills | Status: DC | PRN

## 2021-01-12 NOTE — Telephone Encounter (Signed)
Please advise 

## 2021-01-12 NOTE — Telephone Encounter (Signed)
Pt needs a refill on oxycodone.

## 2021-01-15 ENCOUNTER — Other Ambulatory Visit: Payer: Self-pay | Admitting: Orthopaedic Surgery

## 2021-01-15 NOTE — Telephone Encounter (Signed)
ok 

## 2021-01-18 ENCOUNTER — Encounter: Payer: Self-pay | Admitting: Orthopaedic Surgery

## 2021-01-18 ENCOUNTER — Ambulatory Visit (INDEPENDENT_AMBULATORY_CARE_PROVIDER_SITE_OTHER): Payer: Medicare (Managed Care) | Admitting: Orthopaedic Surgery

## 2021-01-18 ENCOUNTER — Other Ambulatory Visit: Payer: Self-pay | Admitting: Orthopaedic Surgery

## 2021-01-18 ENCOUNTER — Telehealth: Payer: Self-pay | Admitting: Orthopaedic Surgery

## 2021-01-18 DIAGNOSIS — Z96649 Presence of unspecified artificial hip joint: Secondary | ICD-10-CM

## 2021-01-18 MED ORDER — OXYCODONE HCL 5 MG PO TABS: 5.0000 mg | ORAL_TABLET | Freq: Four times a day (QID) | ORAL | 0 refills | Status: AC | PRN

## 2021-01-18 NOTE — Telephone Encounter (Signed)
Pt requesting refill on her prescription of oxycodone. The best pharmacy is the one on file and the best call back number is [phone].

## 2021-01-18 NOTE — Progress Notes (Signed)
The patient comes in today at 2 weeks status post revision arthroplasty of the right hip.  We went up several hip ball lengths in size in order to get her back out the leg length.  She had a severe leg length discrepancy from previous hip replacement surgery that was done on tissue that was severely contracted.  With a long period time dealing with a ligament discrepancy she had developed posture changes in her back and back pain.  She feels much better since surgery.  She is not bending her leg much but she is doing well and feels really good about her leg lengths being closer to equal.  She is to wear a significant shoe buildup and had to have this on most of her shoes.  She does not feel like she needs that now.  She feels like she may just need an over-the-counter insert to wear and her 1 right shoe.  Right hip incision looks good.  The staples were removed and Steri-Strips applied.  There is a moderate hematoma but no seroma that I could aspirate.  Her calf is soft.  I will send in some more pain medication for her.  She will continue to increase her activities as comfort allows.  I would like to see her back in 4 weeks to see how she is doing overall but no x-rays are needed.

## 2021-01-25 ENCOUNTER — Telehealth: Payer: Self-pay | Admitting: Orthopaedic Surgery

## 2021-01-25 ENCOUNTER — Other Ambulatory Visit: Payer: Self-pay

## 2021-01-25 MED ORDER — DICLOFENAC SODIUM 1 % EX GEL: 2.0000 g | Freq: Four times a day (QID) | CUTANEOUS | 1 refills | Status: AC | PRN

## 2021-01-25 NOTE — Telephone Encounter (Signed)
Pt would like refill on voltaren

## 2021-02-14 ENCOUNTER — Encounter: Payer: Self-pay | Admitting: Orthopaedic Surgery

## 2021-02-14 ENCOUNTER — Ambulatory Visit (INDEPENDENT_AMBULATORY_CARE_PROVIDER_SITE_OTHER): Payer: Medicare (Managed Care) | Admitting: Orthopaedic Surgery

## 2021-02-14 ENCOUNTER — Other Ambulatory Visit: Payer: Self-pay

## 2021-02-14 DIAGNOSIS — Z96649 Presence of unspecified artificial hip joint: Secondary | ICD-10-CM

## 2021-02-14 DIAGNOSIS — M2170 Unequal limb length (acquired), unspecified site: Secondary | ICD-10-CM

## 2021-02-14 NOTE — Progress Notes (Signed)
The patient comes in today about 6-week status post a right hip revision arthroplasty where we increased her leg length due to significant shortening from her previous primary right total hip arthroplasty.  She is very pleased overall.  She says she is just a little bit shorter now than before she had to actually have a left buildup on all of her shoes.  She is feeling much better overall.  She is still using crutches but reports that she is doing well and is not in any need of pain medicine.  On exam she is just slightly shorter on her right operative side than the left one before she was several inches shorter.  Her hip moves smoothly on the right side.  Her incisions healed nicely.  I did give her a prescription to take to an orthotics company for just an insert to put in her right shoe which will help balance her gait.  I think this is can help her back as well.  All questions and concerns were answered and addressed.  We do not need to see her back for 6 months unless she is having issues.  We will just have a standing low AP pelvis at that visit.

## 2021-08-15 ENCOUNTER — Ambulatory Visit: Payer: Medicare (Managed Care) | Admitting: Orthopaedic Surgery

## 2021-08-27 ENCOUNTER — Telehealth: Payer: Self-pay | Admitting: Orthopaedic Surgery

## 2021-08-27 NOTE — Telephone Encounter (Signed)
Please send over prescription ? ?Custom insert for right shoe ? ?Fax [phone] ?

## 2021-08-27 NOTE — Telephone Encounter (Signed)
Faxed to provided number  

## 2021-09-10 ENCOUNTER — Encounter: Payer: Self-pay | Admitting: Orthopaedic Surgery

## 2021-09-10 ENCOUNTER — Ambulatory Visit (INDEPENDENT_AMBULATORY_CARE_PROVIDER_SITE_OTHER): Payer: Medicare (Managed Care) | Admitting: Orthopaedic Surgery

## 2021-09-10 ENCOUNTER — Ambulatory Visit (INDEPENDENT_AMBULATORY_CARE_PROVIDER_SITE_OTHER): Payer: Medicare (Managed Care)

## 2021-09-10 DIAGNOSIS — Z96641 Presence of right artificial hip joint: Secondary | ICD-10-CM | POA: Diagnosis not present

## 2021-09-10 DIAGNOSIS — Z96649 Presence of unspecified artificial hip joint: Secondary | ICD-10-CM

## 2021-09-10 NOTE — Progress Notes (Signed)
The patient is a 50 year old female well-known to me.  Several months ago we took to the operating room and was able to remove scar tissue from a previous right total hip arthroplasty and significantly upsize her hip mildly improve her leg lengths.  That is really helped with her posture and now instead of having to have a shoe buildup she only has a small insert to wear in that shoe.  She is having good days and bad days and having pain but overall is making progress. ? ?Her right operative hip moves smoothly and fluidly.  Her leg lengths are close to equal. ? ?An AP pelvis shows a well-seated total hip arthroplasty on the right side with no complicating features. ? ?At this point follow-up is as needed.  I talked her in length in detail about the things we need to bring her back to [place].  All questions and concerns were answered and addressed. ?

## 2023-08-24 IMAGING — DX DG PORTABLE PELVIS
2 series · 2 of 2 positions shown · non-contrast
Comparison: 01/05/2021, 11/14/2020

CLINICAL DATA: Hip revision

EXAM:
PORTABLE PELVIS 1-2 VIEWS

[pelvis ap (1 of 2)]
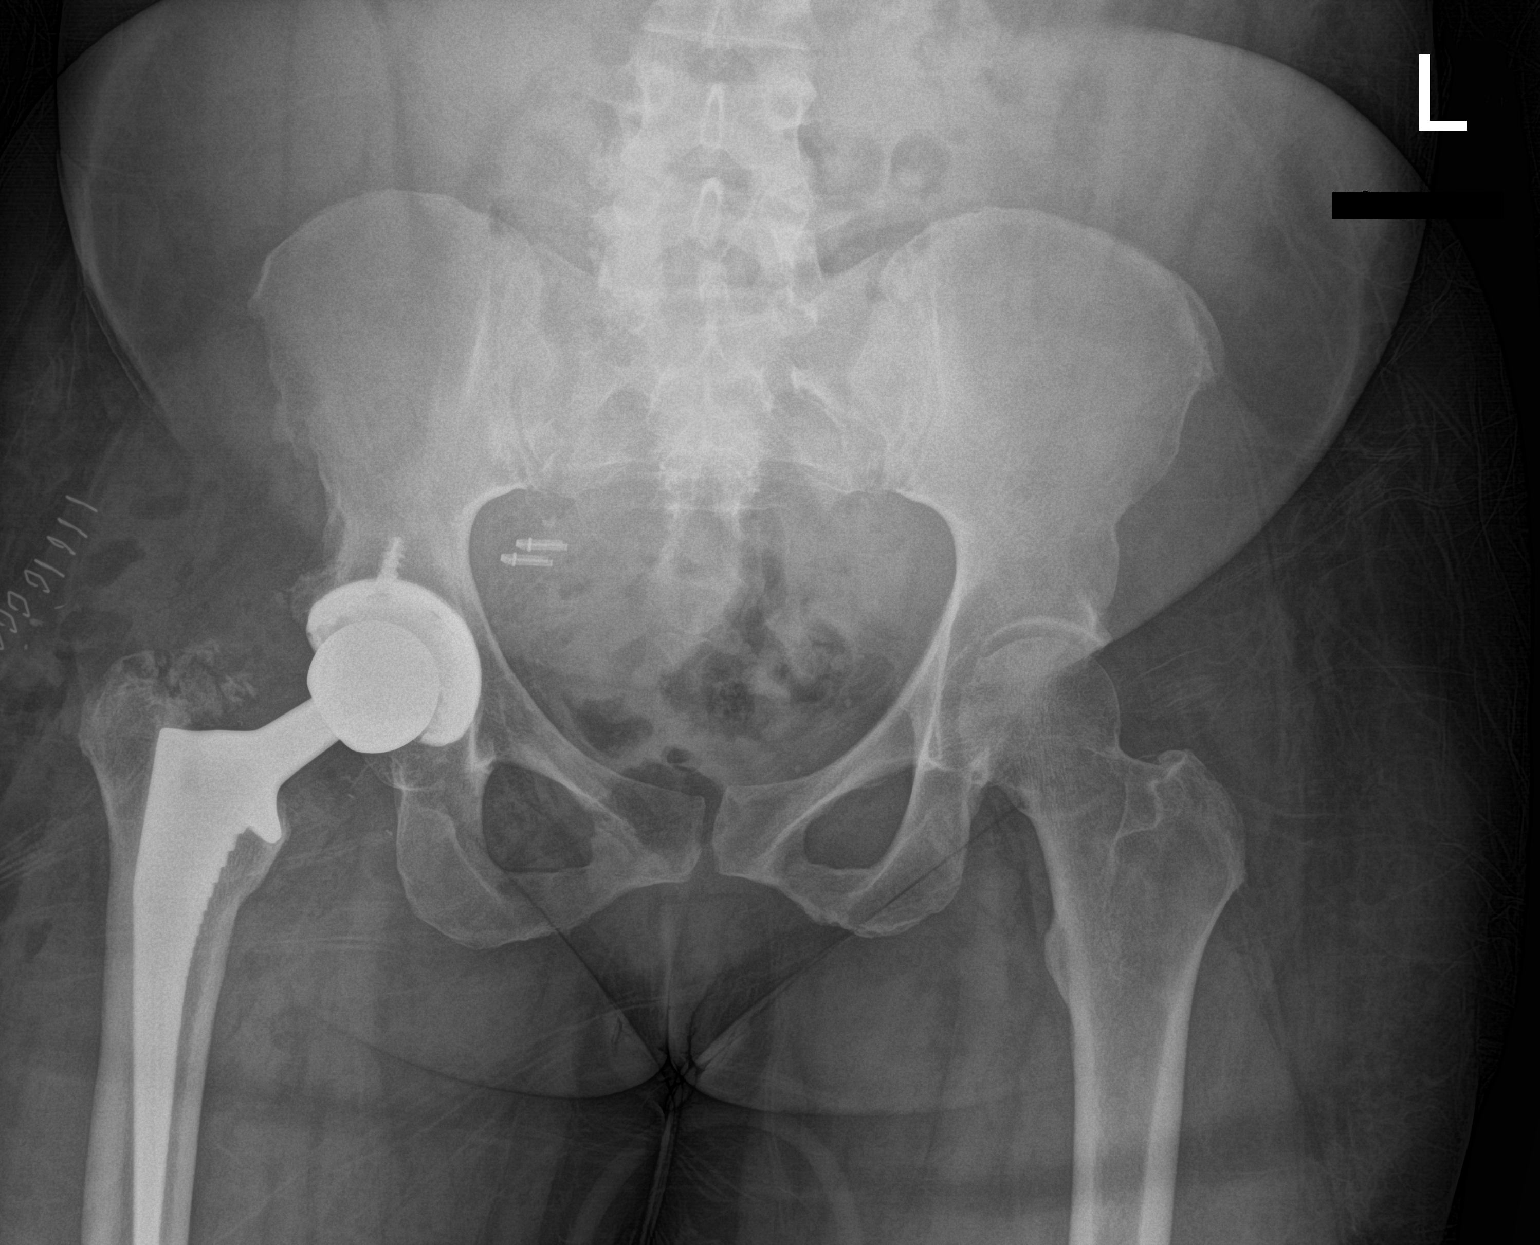

[pelvis ap (2 of 2)]
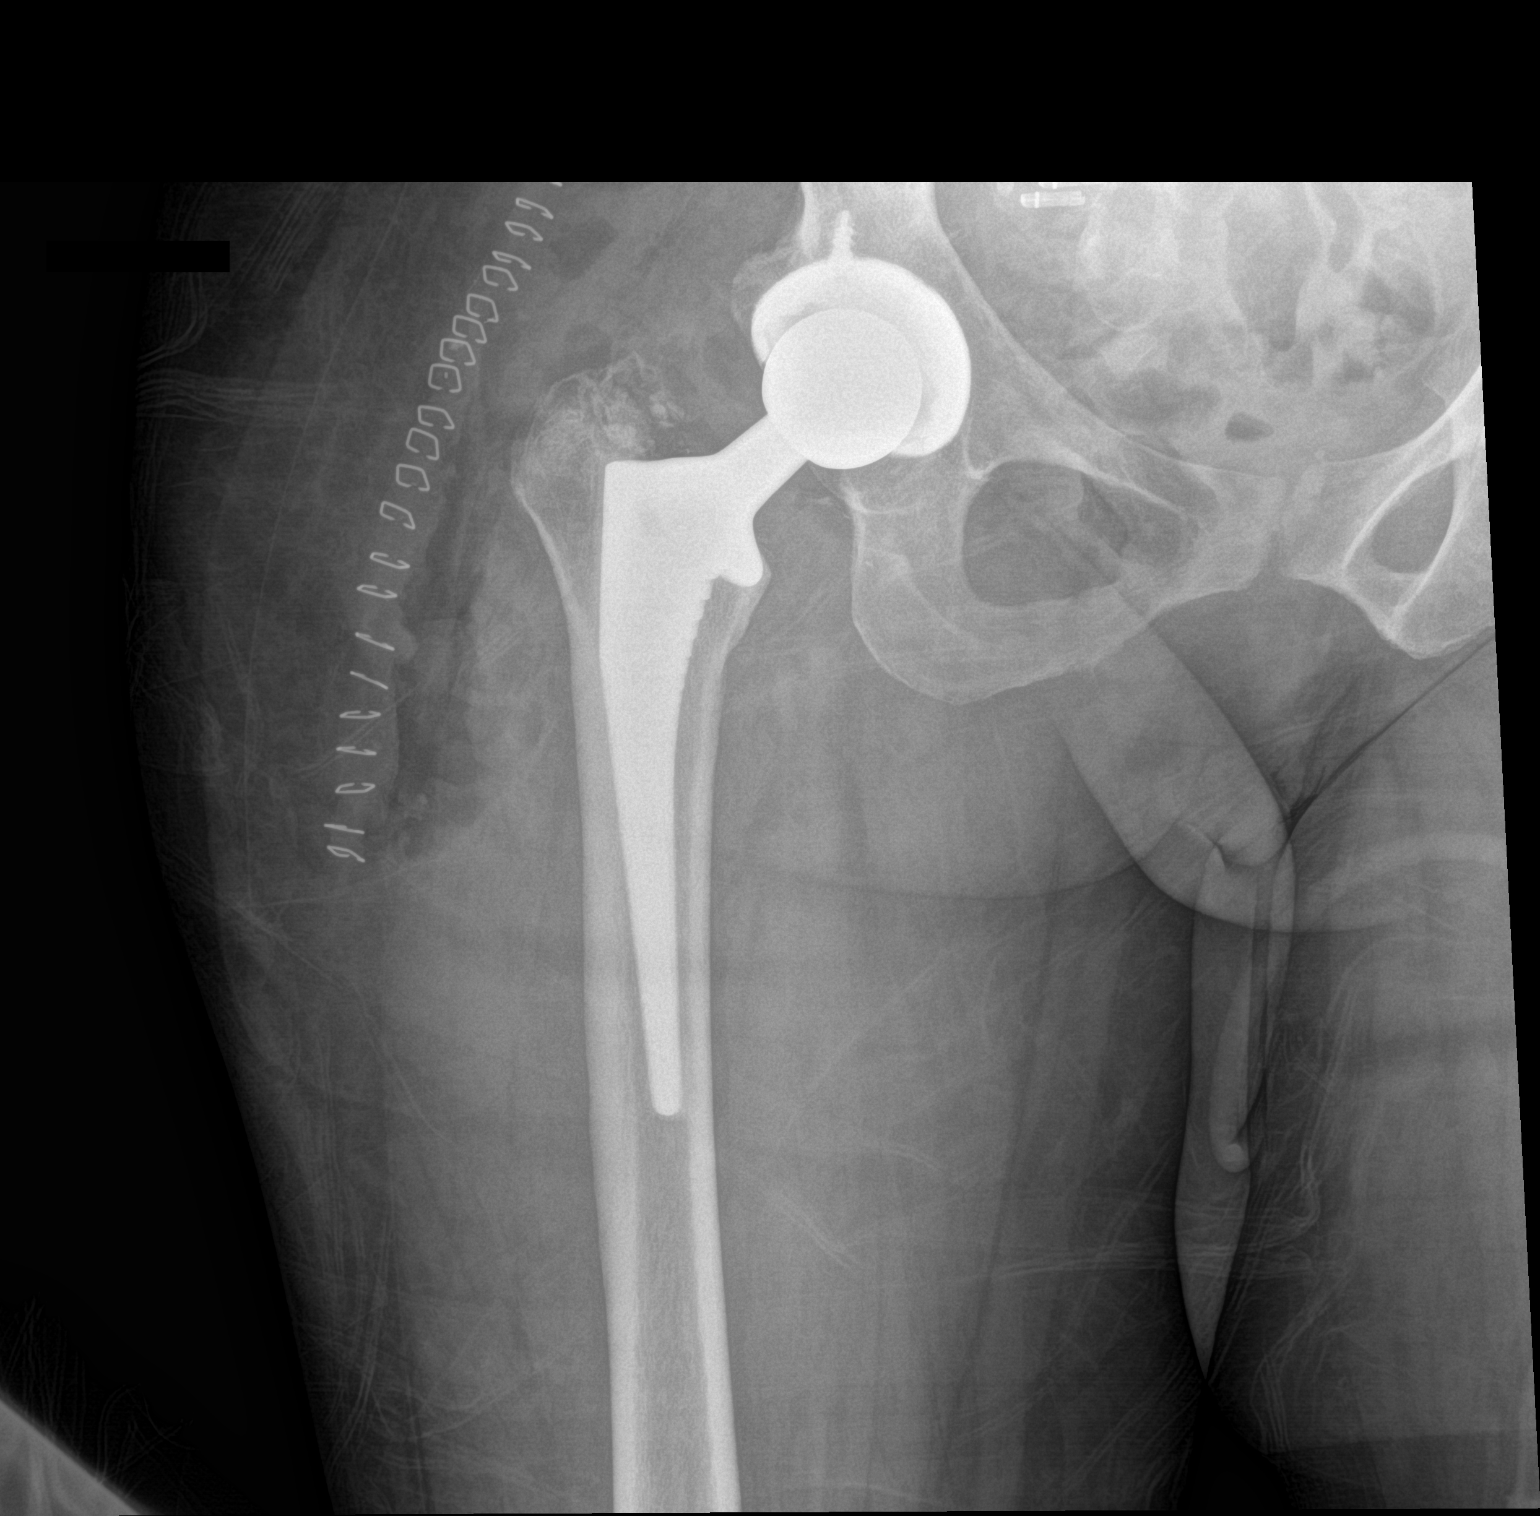

[2 of 2 positions shown; findings below may reference images not displayed]

FINDINGS: Right hip arthroplasty post revision. Gas in the soft tissues
consistent with recent surgery. Normal alignment. No fracture. Clips
in the right pelvis
IMPRESSION: Right hip arthroplasty with postoperative changes
